# Patient Record
Sex: Female | Born: 1960 | Race: White | Hispanic: No | Marital: Married | Smoking: Never smoker
Health system: Southern US, Community
[De-identification: ages and names within clinical notes are randomized; demographics above are authoritative.]

## PROBLEM LIST (undated history)

## (undated) DIAGNOSIS — J45909 Unspecified asthma, uncomplicated: Secondary | ICD-10-CM

## (undated) DIAGNOSIS — G43909 Migraine, unspecified, not intractable, without status migrainosus: Secondary | ICD-10-CM

## (undated) DIAGNOSIS — I10 Essential (primary) hypertension: Secondary | ICD-10-CM

## (undated) HISTORY — PX: PARTIAL HYSTERECTOMY: SHX80

## (undated) HISTORY — PX: APPENDECTOMY: SHX54

---

## 2009-06-25 HISTORY — PX: BREAST BIOPSY: SHX20

## 2012-07-30 ENCOUNTER — Emergency Department: Payer: Self-pay

## 2012-07-30 LAB — CBC
HCT: 41.5 % (ref 35.0–47.0)
HGB: 14.2 g/dL (ref 12.0–16.0)
MCHC: 34.3 g/dL (ref 32.0–36.0)
Platelet: 265 10*3/uL (ref 150–440)
RBC: 4.59 10*6/uL (ref 3.80–5.20)
RDW: 13.5 % (ref 11.5–14.5)
WBC: 10.8 10*3/uL (ref 3.6–11.0)

## 2012-07-30 LAB — BASIC METABOLIC PANEL
Anion Gap: 7 (ref 7–16)
BUN: 15 mg/dL (ref 7–18)
Calcium, Total: 9.2 mg/dL (ref 8.5–10.1)
Chloride: 107 mmol/L (ref 98–107)
Creatinine: 0.88 mg/dL (ref 0.60–1.30)
EGFR (African American): >60
Glucose: 107 mg/dL — ABNORMAL HIGH (ref 65–99)
Osmolality: 279 (ref 275–301)
Potassium: 3.8 mmol/L (ref 3.5–5.1)
Sodium: 139 mmol/L (ref 136–145)

## 2012-09-17 ENCOUNTER — Ambulatory Visit: Payer: Self-pay | Admitting: Registered Nurse

## 2012-11-26 ENCOUNTER — Ambulatory Visit: Payer: Self-pay | Admitting: Internal Medicine

## 2012-12-22 ENCOUNTER — Ambulatory Visit: Payer: Self-pay | Admitting: Internal Medicine

## 2013-02-26 ENCOUNTER — Ambulatory Visit: Payer: Self-pay | Admitting: Specialist

## 2013-02-27 LAB — PATHOLOGY REPORT

## 2013-04-21 ENCOUNTER — Ambulatory Visit: Payer: Self-pay | Admitting: Internal Medicine

## 2013-04-28 ENCOUNTER — Ambulatory Visit: Payer: Self-pay | Admitting: Internal Medicine

## 2013-11-21 ENCOUNTER — Emergency Department: Payer: Self-pay | Admitting: Emergency Medicine

## 2013-11-21 LAB — CBC
HCT: 42 % (ref 35.0–47.0)
HGB: 13.9 g/dL (ref 12.0–16.0)
MCH: 29.3 pg (ref 26.0–34.0)
MCHC: 33.2 g/dL (ref 32.0–36.0)
MCV: 88 fL (ref 80–100)
Platelet: 227 10*3/uL (ref 150–440)
RBC: 4.75 10*6/uL (ref 3.80–5.20)
RDW: 14.1 % (ref 11.5–14.5)
WBC: 5.8 10*3/uL (ref 3.6–11.0)

## 2013-11-21 LAB — COMPREHENSIVE METABOLIC PANEL
ALBUMIN: 4.1 g/dL (ref 3.4–5.0)
AST: 11 U/L — AB (ref 15–37)
Alkaline Phosphatase: 83 U/L
Anion Gap: 7 (ref 7–16)
BUN: 14 mg/dL (ref 7–18)
Bilirubin,Total: 0.5 mg/dL (ref 0.2–1.0)
CO2: 26 mmol/L (ref 21–32)
CREATININE: 1.04 mg/dL (ref 0.60–1.30)
Calcium, Total: 9.6 mg/dL (ref 8.5–10.1)
Chloride: 104 mmol/L (ref 98–107)
EGFR (African American): >60
Glucose: 89 mg/dL (ref 65–99)
OSMOLALITY: 274 (ref 275–301)
POTASSIUM: 3.9 mmol/L (ref 3.5–5.1)
SGPT (ALT): 16 U/L (ref 12–78)
Sodium: 137 mmol/L (ref 136–145)
Total Protein: 7.4 g/dL (ref 6.4–8.2)

## 2013-12-09 ENCOUNTER — Ambulatory Visit: Payer: Self-pay | Admitting: Nurse Practitioner

## 2014-06-30 ENCOUNTER — Ambulatory Visit: Payer: Self-pay | Admitting: Otolaryngology

## 2014-10-15 NOTE — Op Note (Signed)
PATIENT NAME:  Kayla Christian, [place] MR#:  [id number] DATE OF BIRTH:  April 19, 1961  DATE OF PROCEDURE:  02/26/2013  PREOPERATIVE DIAGNOSIS:  Neuroma, left foot third web space.   POSTOPERATIVE DIAGNOSIS:  Neuroma, left foot third web space.   PROCEDURE: Excision of neuroma, left foot third web space.   SURGEON: Valinda HoarHoward E. Valencia Kassa, M.D.   ANESTHESIA: General LMA.   COMPLICATIONS: None.   DRAINS: None.   ESTIMATED BLOOD LOSS: None. Replaced: None.   DESCRIPTION OF PROCEDURE: The patient was brought to the operating room where she underwent satisfactory general LMA anesthesia in the supine position. The left leg was prepped and draped in a sterile fashion, and an Esmarch was applied to the tourniquet just above the ankle. The tourniquet was inflated to 250 mmHg. Tourniquet time was 17 minutes. The longitudinal incision was made in the dorsum of the left third web space extending proximally. Dissection was carried out bluntly under loupe magnification with electrocautery being used on small vessels. The laminar spreader was inserted, and the transverse metatarsal ligament was identified and divided. There was significant scarring in the distal pad of the web space. The medial and lateral branches of the nerve distally were identified and amputated distally. These were then followed proximally to the neuroma, which was freed up from scar tissue, and the nerve was followed proximally, proximal to the transverse metatarsal ligament and amputated. This was sent as a specimen. Additional dissection was carried out to remove scarring in web space. The wound was then irrigated. The sponge was inserted and the tourniquet deflated with good return of blood flow to all toes. There was minimal bleeding in the wound with electrocautery being used on small bleeders. The wound was then irrigated again and closed with 3-0 Vicryl in the subcutaneous tissue and 4-0 nylon on skin. Marcaine 0.5% was placed in the wound, and a dry  sterile dressing was applied. The patient was awakened and taken to recovery room in good condition.    ____________________________ Valinda HoarHoward E. Nehemie Casserly, MD hem:dmm D: 02/26/2013 13:41:40 ET T: 02/26/2013 14:48:26 ET JOB#: [id number]  cc: Valinda HoarHoward E. Alayia Meggison, MD, <Dictator> Valinda HoarHOWARD E Justinn Welter MD ELECTRONICALLY SIGNED 02/27/2013 13:16

## 2014-10-18 LAB — SURGICAL PATHOLOGY

## 2015-03-01 ENCOUNTER — Ambulatory Visit: Payer: Self-pay

## 2015-04-25 ENCOUNTER — Other Ambulatory Visit: Payer: Self-pay | Admitting: Nurse Practitioner

## 2015-04-25 DIAGNOSIS — Z1231 Encounter for screening mammogram for malignant neoplasm of breast: Secondary | ICD-10-CM

## 2015-05-05 ENCOUNTER — Ambulatory Visit
Admission: RE | Admit: 2015-05-05 | Discharge: 2015-05-05 | Disposition: A | Discharge status: Home / Self Care | Payer: 59 | Source: Ambulatory Visit | Attending: Nurse Practitioner | Admitting: Nurse Practitioner

## 2015-05-05 DIAGNOSIS — R921 Mammographic calcification found on diagnostic imaging of breast: Secondary | ICD-10-CM | POA: Diagnosis not present

## 2015-05-05 DIAGNOSIS — Z1231 Encounter for screening mammogram for malignant neoplasm of breast: Secondary | ICD-10-CM | POA: Insufficient documentation

## 2015-05-09 ENCOUNTER — Other Ambulatory Visit: Payer: Self-pay | Admitting: Nurse Practitioner

## 2015-05-09 DIAGNOSIS — R921 Mammographic calcification found on diagnostic imaging of breast: Secondary | ICD-10-CM

## 2015-05-11 ENCOUNTER — Ambulatory Visit
Admission: RE | Admit: 2015-05-11 | Discharge: 2015-05-11 | Disposition: A | Discharge status: Home / Self Care | Payer: 59 | Source: Ambulatory Visit | Attending: Nurse Practitioner | Admitting: Nurse Practitioner

## 2015-05-11 DIAGNOSIS — R921 Mammographic calcification found on diagnostic imaging of breast: Secondary | ICD-10-CM | POA: Diagnosis present

## 2015-05-24 ENCOUNTER — Ambulatory Visit: Payer: 59

## 2015-05-24 ENCOUNTER — Other Ambulatory Visit: Payer: 59

## 2015-06-19 IMAGING — CR DG FOOT COMPLETE 3+V*L*
1 series · 3 of 3 positions shown · non-contrast
Comparison: none

REASON FOR EXAM: lt foot and ankle pain/edema x 2 months no known injury
COMMENTS:

PROCEDURE:     DXR - DXR FOOT LT COMP W/OBLIQUES  - December 22, 2012  [DATE]
RESULT:     Comparison:  None

[Series 1: x foot ap left · 0.14mm/px · 3 of 3 slices shown]
[im 1/3]
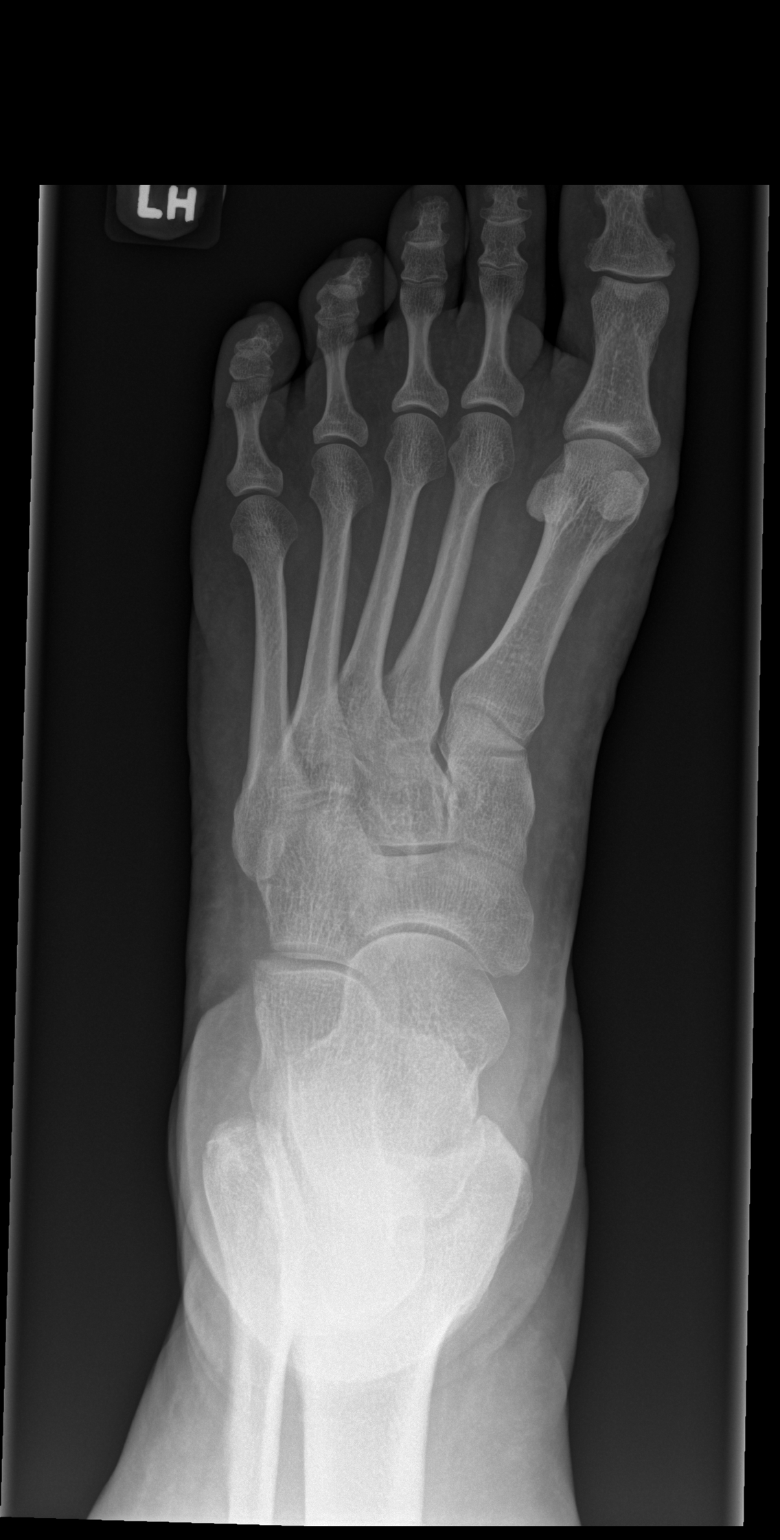
[im 2/3]
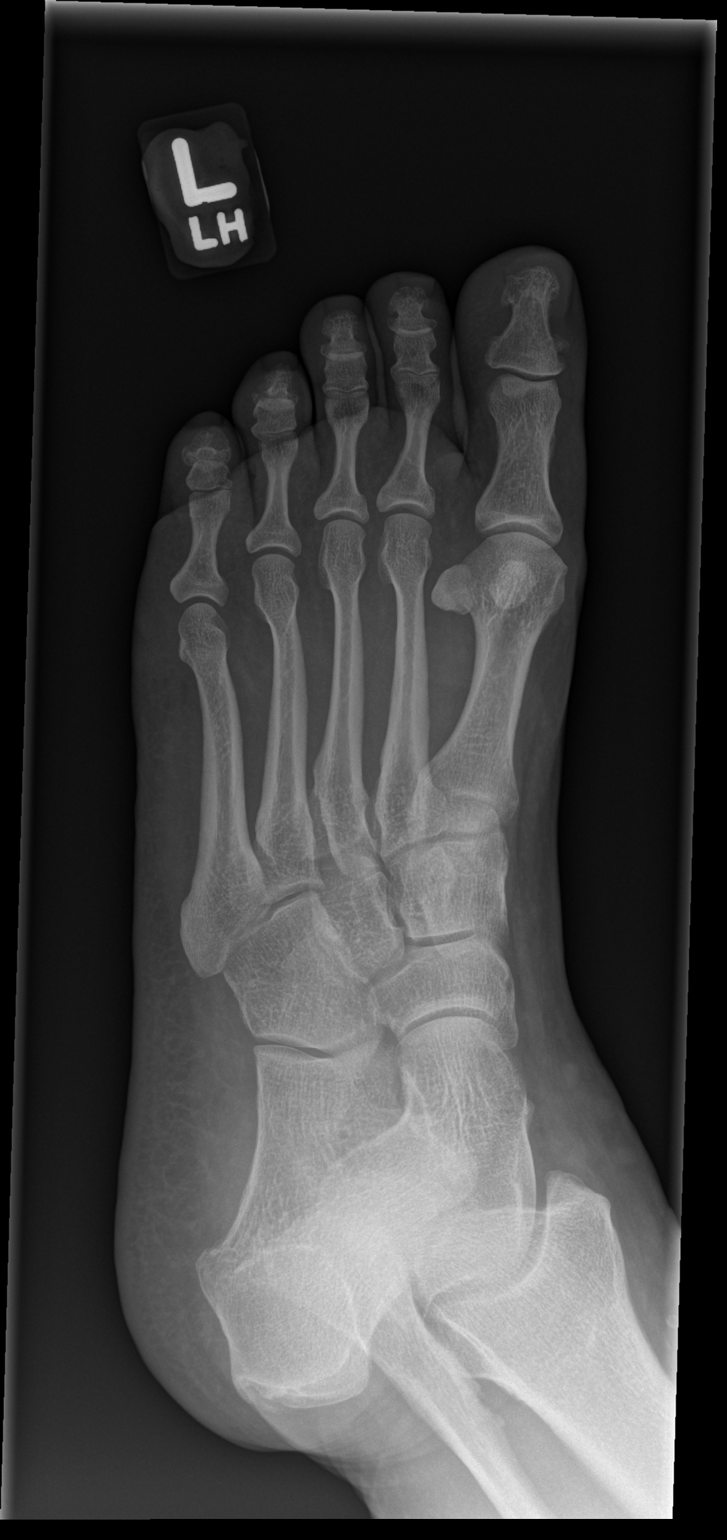
[im 3/3]
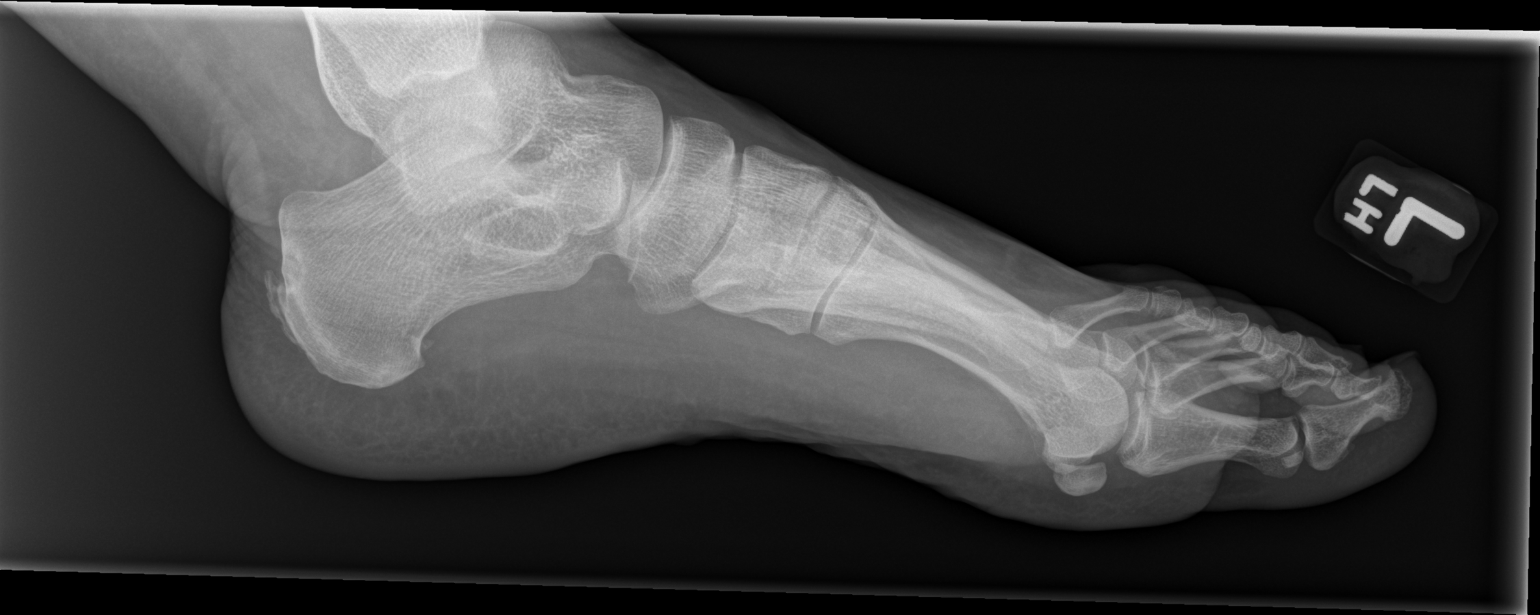

[3 of 3 positions shown; findings below may reference images not displayed]

FINDINGS: AP, oblique, and lateral views of the left foot demonstrates no fracture or
dislocation. There is no soft tissue abnormality. There is no subcutaneous
emphysema or radiopaque foreign bodies.
IMPRESSION: No acute osseous injury of the left foot.

[REDACTED]

## 2015-06-19 IMAGING — CR DG ANKLE COMPLETE 3+V*L*
1 series · 5 of 5 positions shown · non-contrast
Comparison: none

REASON FOR EXAM: lt foot and ankle pain/edema x 2 months no known injury
COMMENTS:

PROCEDURE:     DXR - DXR ANKLE LEFT COMPLETE  - December 22, 2012  [DATE]
RESULT:     Comparison: None

[Series 1: x ankle lat left · 0.14mm/px · 5 of 5 slices shown]
[im 1/5]
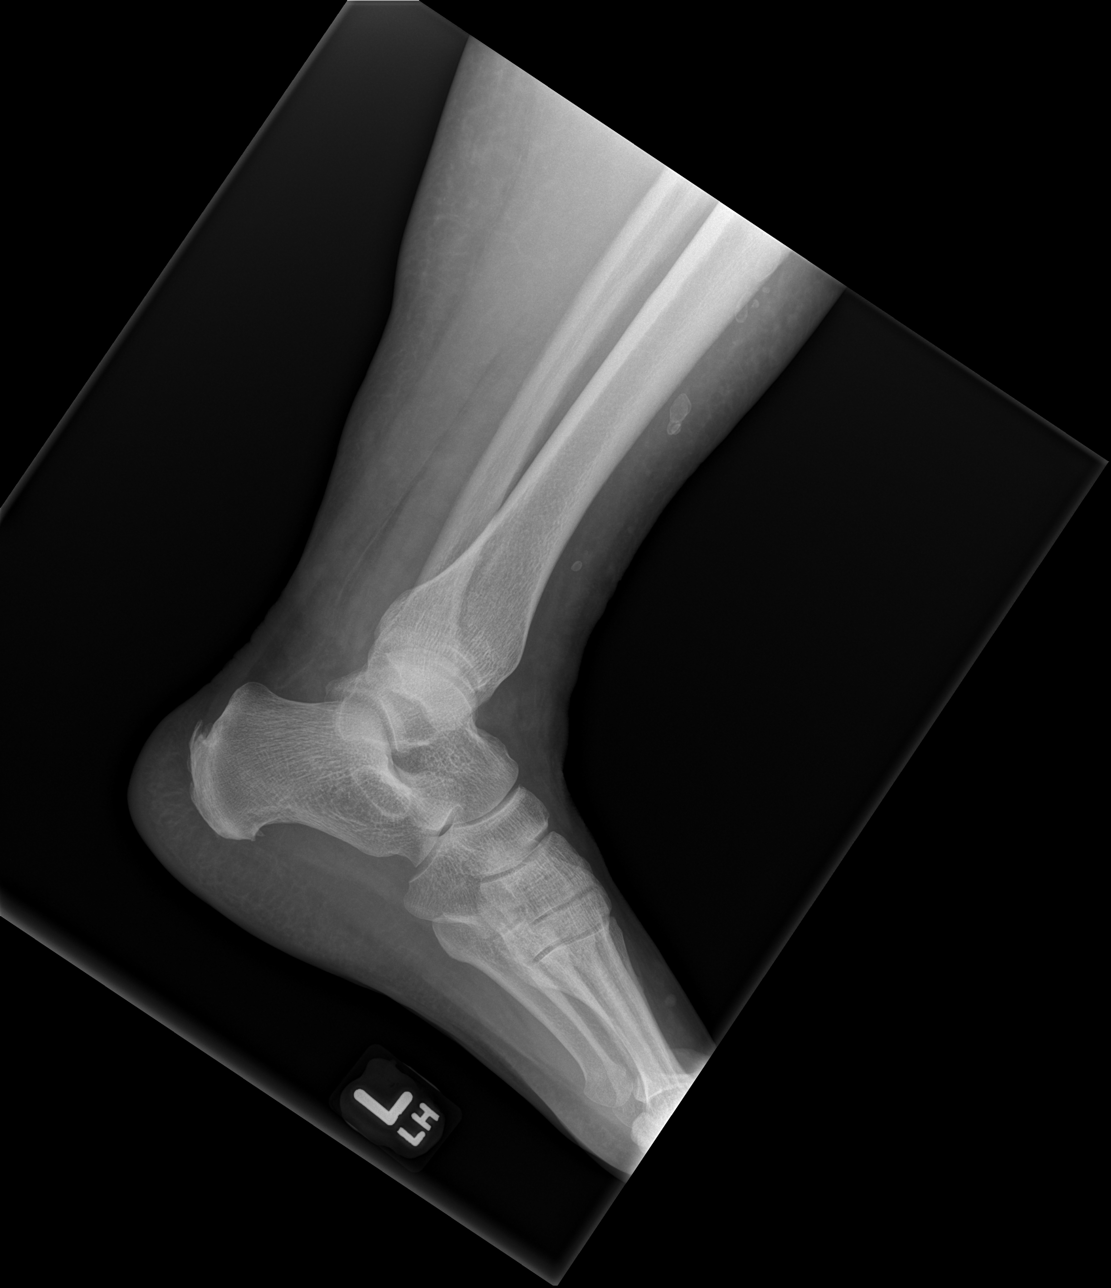
[im 2/5]
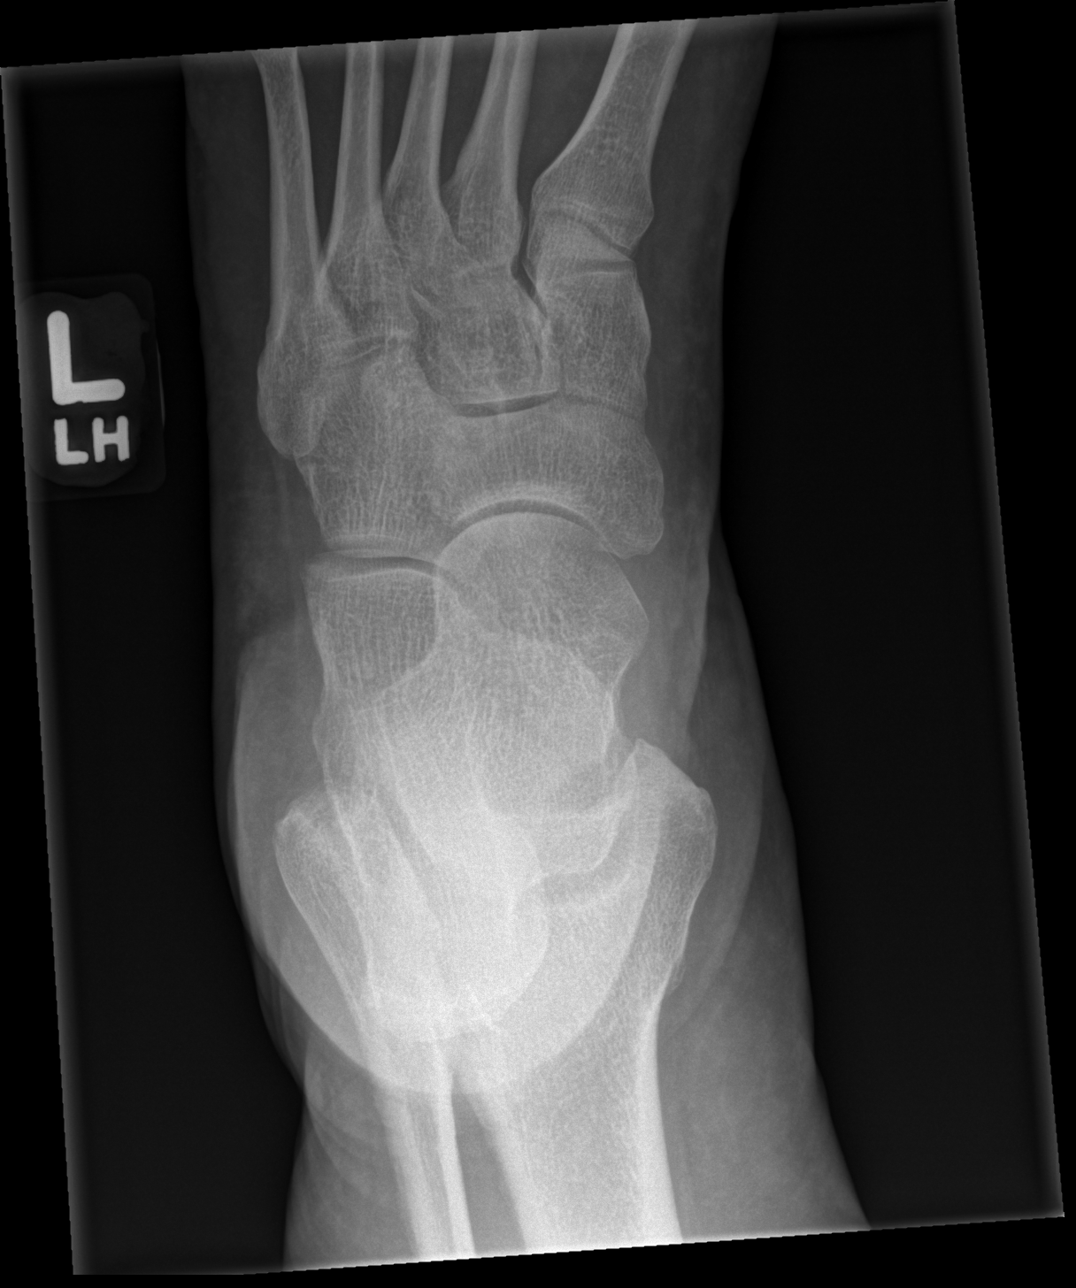
[im 3/5]
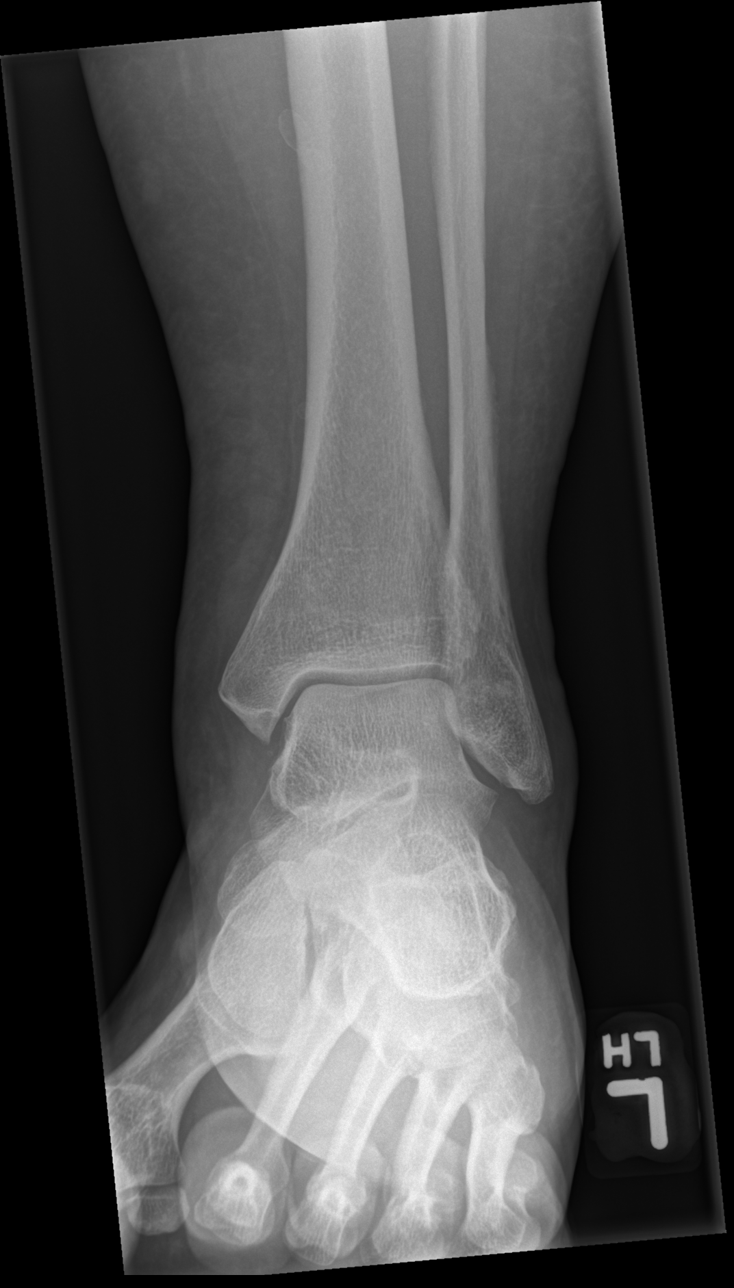
[im 4/5]
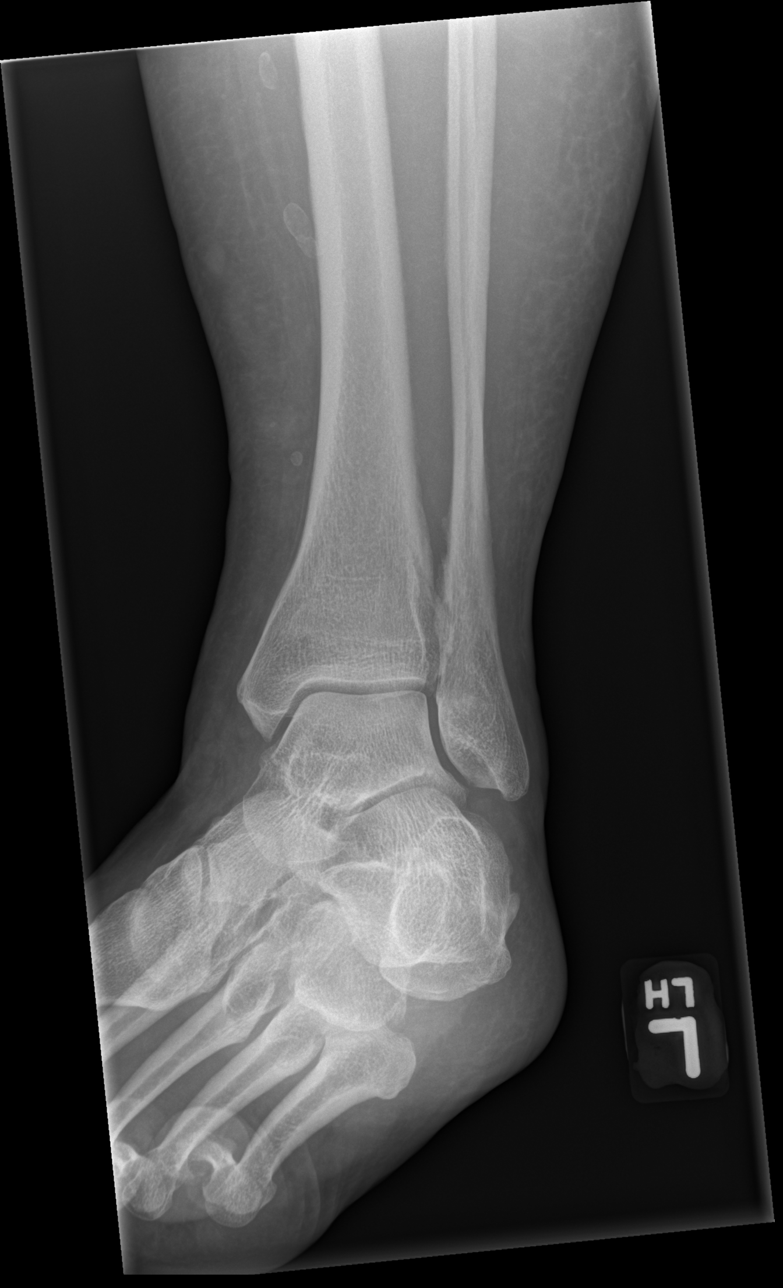
[im 5/5]
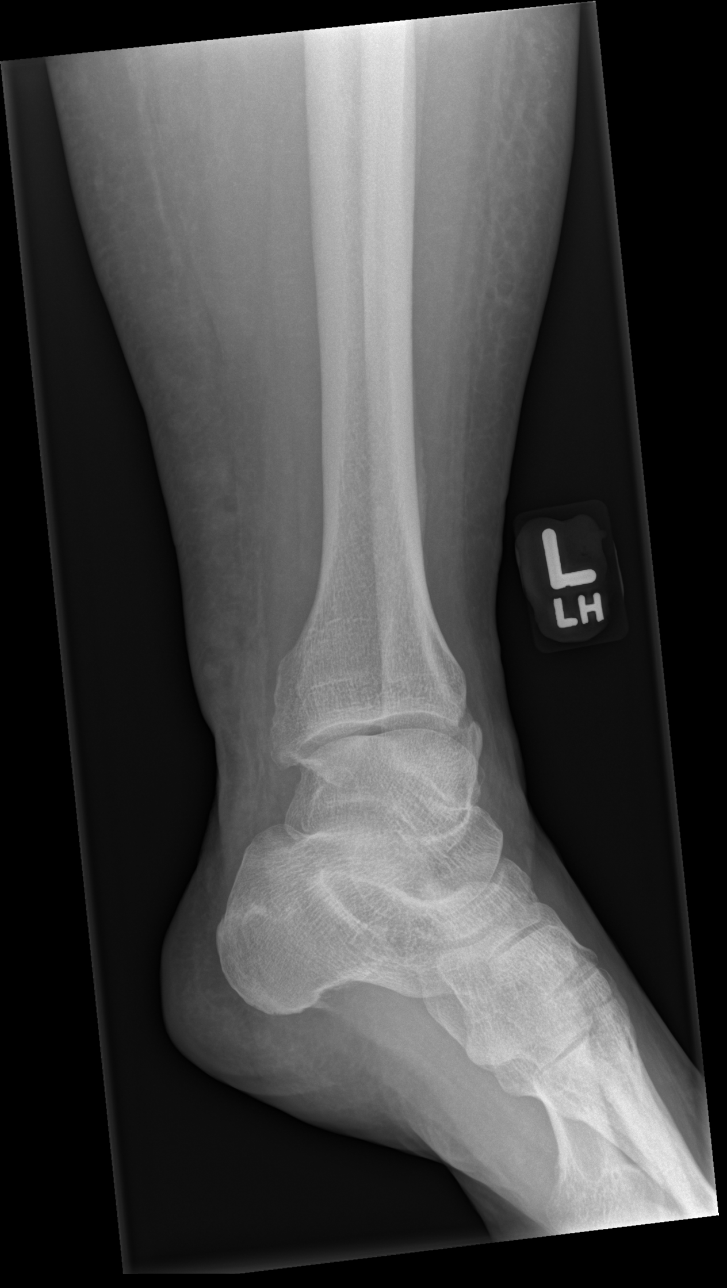

[5 of 5 positions shown; findings below may reference images not displayed]

FINDINGS: 5 views of the left ankle demonstrate no fracture or dislocation. There is a
tiny plantar calcaneal spur. There ankle mortise is intact. There is no
significant joint effusion. The soft tissues are normal.
IMPRESSION: No acute osseous injury of the left ankle.

[REDACTED]

## 2015-07-09 ENCOUNTER — Emergency Department
Admission: EM | Admit: 2015-07-09 | Discharge: 2015-07-09 | Disposition: A | Discharge status: Home / Self Care | Payer: 59 | Attending: Emergency Medicine | Admitting: Emergency Medicine

## 2015-07-09 ENCOUNTER — Encounter: Payer: Self-pay | Admitting: Medical Oncology

## 2015-07-09 DIAGNOSIS — Z792 Long term (current) use of antibiotics: Secondary | ICD-10-CM | POA: Diagnosis not present

## 2015-07-09 DIAGNOSIS — Z79899 Other long term (current) drug therapy: Secondary | ICD-10-CM | POA: Diagnosis not present

## 2015-07-09 DIAGNOSIS — R52 Pain, unspecified: Secondary | ICD-10-CM | POA: Diagnosis present

## 2015-07-09 DIAGNOSIS — J40 Bronchitis, not specified as acute or chronic: Secondary | ICD-10-CM

## 2015-07-09 DIAGNOSIS — J209 Acute bronchitis, unspecified: Secondary | ICD-10-CM | POA: Insufficient documentation

## 2015-07-09 DIAGNOSIS — J0100 Acute maxillary sinusitis, unspecified: Secondary | ICD-10-CM | POA: Diagnosis not present

## 2015-07-09 MED ORDER — FEXOFENADINE-PSEUDOEPHED ER 60-120 MG PO TB12: 1.0000 | ORAL_TABLET | Freq: Two times a day (BID) | ORAL | Status: DC

## 2015-07-09 MED ORDER — IBUPROFEN 800 MG PO TABS: 800.0000 mg | ORAL_TABLET | Freq: Three times a day (TID) | ORAL | Status: AC | PRN

## 2015-07-09 MED ORDER — ONDANSETRON 8 MG PO TBDP
8.0000 mg | ORAL_TABLET | Freq: Once | ORAL | Status: AC
  Administered 2015-07-09: 8 mg via ORAL
  Filled 2015-07-09: qty 1

## 2015-07-09 MED ORDER — TRAMADOL HCL 50 MG PO TABS: 50.0000 mg | ORAL_TABLET | Freq: Four times a day (QID) | ORAL | Status: AC | PRN

## 2015-07-09 MED ORDER — KETOROLAC TROMETHAMINE 60 MG/2ML IM SOLN
60.0000 mg | Freq: Once | INTRAMUSCULAR | Status: AC
  Administered 2015-07-09: 60 mg via INTRAMUSCULAR
  Filled 2015-07-09: qty 2

## 2015-07-09 MED ORDER — AMOXICILLIN 500 MG PO CAPS: 500.0000 mg | ORAL_CAPSULE | Freq: Three times a day (TID) | ORAL | Status: DC

## 2015-07-09 MED ORDER — HYDROMORPHONE HCL 1 MG/ML IJ SOLN
1.0000 mg | Freq: Once | INTRAMUSCULAR | Status: AC
  Administered 2015-07-09: 1 mg via INTRAMUSCULAR
  Filled 2015-07-09: qty 1

## 2015-07-09 MED ORDER — SULFAMETHOXAZOLE-TRIMETHOPRIM 800-160 MG PO TABS
1.0000 | ORAL_TABLET | Freq: Two times a day (BID) | ORAL | Status: DC
Start: 2015-07-09 — End: 2023-04-26

## 2015-07-09 NOTE — ED Provider Notes (Signed)
[facility] Emergency Department Provider Note  ____________________________________________  Time seen: Approximately 2:48 PM  I have reviewed the triage vital signs and the nursing notes.   HISTORY  Chief Complaint Generalized Body Aches and URI    HPI Kayla Christian is a 55 y.o. female patient complaining of 2-3 days of body aches and pains. She also complaining of sinus congestion, sore throat, nonproductive cough. Patient states she recently [place] from [place] from a business trip. Patient denies any vomiting or  diarrhea but states she's developing nausea.Patient states she's had intermittent fever and chills. No palliative measures taken for her complaint.   History reviewed. No pertinent past medical history.  There are no active problems to display for this patient.   Past Surgical History  Procedure Laterality Date  . Breast biopsy Bilateral 2011    neg    Current Outpatient Rx  Name  Route  Sig  Dispense  Refill  . amoxicillin (AMOXIL) 500 MG capsule   Oral   Take 1 capsule (500 mg total) by mouth 3 (three) times daily.   30 capsule   0   . fexofenadine-pseudoephedrine (ALLEGRA-D) 60-120 MG 12 hr tablet   Oral   Take 1 tablet by mouth 2 (two) times daily.   30 tablet   0   . ibuprofen (ADVIL,MOTRIN) 800 MG tablet   Oral   Take 1 tablet (800 mg total) by mouth every 8 (eight) hours as needed.   30 tablet   0   . traMADol (ULTRAM) 50 MG tablet   Oral   Take 1 tablet (50 mg total) by mouth every 6 (six) hours as needed for moderate pain.   12 tablet   0     Allergies Review of patient's allergies indicates not on file.  Family History  Problem Relation Age of Onset  . Breast cancer Mother 8330    Social History Social History  Substance Use Topics  . Smoking status: None  . Smokeless tobacco: None  . Alcohol Use: None    Review of Systems Constitutional: Fever and chills. Body ache Eyes: No visual  changes. ENT: No throat. Nasal congestion and facial pain. Cardiovascular: Denies chest pain. Respiratory: Denies shortness of breath. Gastrointestinal: No abdominal pain. Nausea, no vomiting.  No diarrhea.  No constipation. Genitourinary: Negative for dysuria. Musculoskeletal: Positive for back pain. Skin: Negative for rash. Neurological: Negative for headaches, focal weakness or numbness.    ____________________________________________   PHYSICAL EXAM:  VITAL SIGNS: ED Triage Vitals  Enc Vitals Group     BP 07/09/15 1406 150/80 mmHg     Pulse Rate 07/09/15 1406 107     Resp 07/09/15 1406 24     Temp 07/09/15 1406 99.9 F (37.7 C)     Temp Source 07/09/15 1406 Oral     SpO2 07/09/15 1406 99 %     Weight 07/09/15 1406 230 lb (104.327 kg)     Height 07/09/15 1406 5\' 7"  (1.702 m)     Head Cir --      Peak Flow --      Pain Score 07/09/15 1416 10     Pain Loc --      Pain Edu? --      Excl. in GC? --     Constitutional: Alert and oriented. Mild distress. Eyes: Conjunctivae are normal. PERRL. EOMI. Head: Atraumatic. Nose: Bilateral maxillary guarding. A edematous nasal turbinates. Mouth/Throat: Mucous membranes are moist.  Oropharynx erythematous. Erythematous with postnasal drainage.  Neck: No stridor.  No cervical spine tenderness to palpation. Hematological/Lymphatic/Immunilogical: No cervical lymphadenopathy. Cardiovascular: Normal rate, regular rhythm. Grossly normal heart sounds.  Good peripheral circulation. Respiratory: Normal respiratory effort.  No retractions. Lungs with mild Rales. Gastrointestinal: Soft and nontender. No distention. No abdominal bruits. No CVA tenderness. Musculoskeletal: No lower extremity tenderness nor edema.  No joint effusions. Neurologic:  Normal speech and language. No gross focal neurologic deficits are appreciated. No gait instability. Skin:  Skin is warm, dry and intact. No rash noted. Psychiatric: Mood and affect are normal. Speech  and behavior are normal.  ____________________________________________   LABS (all labs ordered are listed, but only abnormal results are displayed)  Labs Reviewed - No data to display ____________________________________________  EKG   ____________________________________________  RADIOLOGY   ____________________________________________   PROCEDURES  Procedure(s) performed: None  Critical Care performed: No  ____________________________________________   INITIAL IMPRESSION / ASSESSMENT AND PLAN / ED COURSE  Pertinent labs & imaging results that were available during my care of the patient were reviewed by me and considered in my medical decision making (see chart for details).  Maxillary sinusitis and bronchitis.. She given discharge care instructions. Patient given a prescription for amoxicillin. Allegra-D, Ibuprofen, and tramadol. Advised [facility] to [facility]. ____________________________________________   FINAL CLINICAL IMPRESSION(S) / ED DIAGNOSES  Final diagnoses:  Acute maxillary sinusitis, recurrence not specified  Bronchitis      Joni Reining, PA-C 07/09/15 1507  Governor Rooks, MD 07/10/15 [phone]

## 2015-07-09 NOTE — ED Notes (Signed)
Pt reports she has been having body aches and cold sx's for a couple of days.

## 2015-07-10 NOTE — Care Management Note (Signed)
Case Management Note  Patient Details  Name: Babs SciaraCynthia S Papandrea MRN: [id number] Date of Birth: 28-Jun-1960  Subjective/Objective:    Referral discussed with Shea Stakesona George at [facility]. Ms Earlene PlaterDavis chose [facility] because she has used them in the past. Vira BlancoDona has the discharge information and the home health orders for RN, Aid, PT, OT, SW. Updated Vira BlancoDona that Ms Earlene PlaterDavis goes to dialysis on Mon-Wed-Fri.                 Action/Plan:   Expected Discharge Date:                  Expected Discharge Plan:     In-House Referral:     Discharge planning Services     Post Acute Care Choice:    Choice offered to:     DME Arranged:    DME Agency:     HH Arranged:    HH Agency:     Status of Service:     Medicare Important Message Given:    Date Medicare IM Given:    Medicare IM give by:    Date Additional Medicare IM Given:    Additional Medicare Important Message give by:     If discussed at Long Length of Stay Meetings, dates discussed:    Additional Comments:  Rebekkah Powless A, RN 07/10/2015, 11:44 AM

## 2015-10-04 ENCOUNTER — Emergency Department
Admission: EM | Admit: 2015-10-04 | Discharge: 2015-10-04 | Disposition: A | Discharge status: Home / Self Care | Payer: Managed Care, Other (non HMO) | Attending: Emergency Medicine | Admitting: Emergency Medicine

## 2015-10-04 DIAGNOSIS — G43909 Migraine, unspecified, not intractable, without status migrainosus: Secondary | ICD-10-CM | POA: Diagnosis present

## 2015-10-04 DIAGNOSIS — G43809 Other migraine, not intractable, without status migrainosus: Secondary | ICD-10-CM

## 2015-10-04 MED ORDER — DIPHENHYDRAMINE HCL 50 MG/ML IJ SOLN
12.5000 mg | Freq: Once | INTRAMUSCULAR | Status: AC
  Administered 2015-10-04: 12.5 mg via INTRAVENOUS
  Filled 2015-10-04: qty 1

## 2015-10-04 MED ORDER — PROCHLORPERAZINE EDISYLATE 5 MG/ML IJ SOLN
10.0000 mg | Freq: Once | INTRAMUSCULAR | Status: AC
  Administered 2015-10-04: 10 mg via INTRAVENOUS
  Filled 2015-10-04: qty 2

## 2015-10-04 MED ORDER — SODIUM CHLORIDE 0.9 % IV SOLN
Freq: Once | INTRAVENOUS | Status: AC
  Administered 2015-10-04: 08:00:00 via INTRAVENOUS

## 2015-10-04 NOTE — Discharge Instructions (Signed)
Migraine Headache A migraine headache is an intense, throbbing pain on one or both sides of your head. A migraine can last for 30 minutes to several hours. CAUSES  The exact cause of a migraine headache is not always known. However, a migraine may be caused when nerves in the brain become irritated and release chemicals that cause inflammation. This causes pain. Certain things may also trigger migraines, such as:  Alcohol.  Smoking.  Stress.  Menstruation.  Aged cheeses.  Foods or drinks that contain nitrates, glutamate, aspartame, or tyramine.  Lack of sleep.  Chocolate.  Caffeine.  Hunger.  Physical exertion.  Fatigue.  Medicines used to treat chest pain (nitroglycerine), birth control pills, estrogen, and some blood pressure medicines. SIGNS AND SYMPTOMS  Pain on one or both sides of your head.  Pulsating or throbbing pain.  Severe pain that prevents daily activities.  Pain that is aggravated by any physical activity.  Nausea, vomiting, or both.  Dizziness.  Pain with exposure to bright lights, loud noises, or activity.  General sensitivity to bright lights, loud noises, or smells. Before you get a migraine, you may get warning signs that a migraine is coming (aura). An aura may include:  Seeing flashing lights.  Seeing bright spots, halos, or zigzag lines.  Having tunnel vision or blurred vision.  Having feelings of numbness or tingling.  Having trouble talking.  Having muscle weakness. DIAGNOSIS  A migraine headache is often diagnosed based on:  Symptoms.  Physical exam.  A CT scan or MRI of your head. These imaging tests cannot diagnose migraines, but they can help rule out other causes of headaches. TREATMENT Medicines may be given for pain and nausea. Medicines can also be given to help prevent recurrent migraines.  HOME CARE INSTRUCTIONS  Only take over-the-counter or prescription medicines for pain or discomfort as directed by your  health care provider. The use of long-term narcotics is not recommended.  Lie down in a dark, quiet room when you have a migraine.  Keep a journal to find out what may trigger your migraine headaches. For example, write down:  What you eat and drink.  How much sleep you get.  Any change to your diet or medicines.  Limit alcohol consumption.  Quit smoking if you smoke.  Get 7-9 hours of sleep, or as recommended by your health care provider.  Limit stress.  Keep lights dim if bright lights bother you and make your migraines worse. SEEK IMMEDIATE MEDICAL CARE IF:   Your migraine becomes severe.  You have a fever.  You have a stiff neck.  You have vision loss.  You have muscular weakness or loss of muscle control.  You start losing your balance or have trouble walking.  You feel faint or pass out.  You have severe symptoms that are different from your first symptoms. MAKE SURE YOU:   Understand these instructions.  Will watch your condition.  Will get help right away if you are not doing well or get worse.   This information is not intended to replace advice given to you by your health care provider. Make sure you discuss any questions you have with your health care provider.   Document Released: 06/11/2005 Document Revised: 07/02/2014 Document Reviewed: 02/16/2013 Elsevier Interactive Patient Education [employer]2016 [employer].  Please return for any further problems follow-up with your doctor or see the trauma clinic or Kayla Christian or one of the other local healthcare providers

## 2015-10-04 NOTE — ED Notes (Signed)
Pt in with co migraine that started tonight, hx of the same.

## 2015-10-04 NOTE — ED Provider Notes (Signed)
[facility] Emergency Department Provider Note  ____________________________________________  Time seen: Approximately 8:20 AM  I have reviewed the triage vital signs and the nursing notes.   HISTORY  Chief Complaint Migraine    HPI Kayla Christian is a 55 y.o. female who complains of onset of her usual bad migraine although this was aware she's had in some time. She reports her usual blurry vision nausea vomiting headache made worse by bright lights or loud noises. Headache is diffuse especially frontal and again basically the same she's had before 8-9 out of 10. Headache started last night   No past medical history on file.  There are no active problems to display for this patient.   Past Surgical History  Procedure Laterality Date  . Breast biopsy Bilateral 2011    neg    Current Outpatient Rx  Name  Route  Sig  Dispense  Refill  . fexofenadine-pseudoephedrine (ALLEGRA-D) 60-120 MG 12 hr tablet   Oral   Take 1 tablet by mouth 2 (two) times daily.   30 tablet   0   . ibuprofen (ADVIL,MOTRIN) 800 MG tablet   Oral   Take 1 tablet (800 mg total) by mouth every 8 (eight) hours as needed.   30 tablet   0   . sulfamethoxazole-trimethoprim (BACTRIM DS,SEPTRA DS) 800-160 MG tablet   Oral   Take 1 tablet by mouth 2 (two) times daily.   20 tablet   0   . traMADol (ULTRAM) 50 MG tablet   Oral   Take 1 tablet (50 mg total) by mouth every 6 (six) hours as needed for moderate pain.   12 tablet   0     Allergies Penicillins and Prednisone  Family History  Problem Relation Age of Onset  . Breast cancer Mother 3530    Social History Social History  Substance Use Topics  . Smoking status: Not on file  . Smokeless tobacco: Not on file  . Alcohol Use: Not on file    Review of Systems Constitutional: No fever/chills Eyes: See history of present illness ENT: No sore throat. Cardiovascular: Denies chest pain. Respiratory: Denies shortness  of breath. Gastrointestinal: No abdominal pain.  No nausea, no vomiting.  No diarrhea.  No constipation. Genitourinary: Negative for dysuria. Musculoskeletal: Negative for back pain. Skin: Negative for rash. Neurological: Negative for headaches, focal weakness or numbness.  10-point ROS otherwise negative.  ____________________________________________   PHYSICAL EXAM:  VITAL SIGNS: ED Triage Vitals  Enc Vitals Group     BP 10/04/15 0629 157/88 mmHg     Pulse Rate 10/04/15 0627 78     Resp 10/04/15 0627 18     Temp 10/04/15 0627 97.6 F (36.4 C)     Temp Source 10/04/15 0627 Oral     SpO2 10/04/15 0627 97 %     Weight 10/04/15 0627 220 lb (99.791 kg)     Height 10/04/15 0627 5\' 6"  (1.676 m)     Head Cir --      Peak Flow --      Pain Score 10/04/15 0628 10     Pain Loc --      Pain Edu? --      Excl. in GC? --     Constitutional: Alert and oriented. Leaning over the vomiting bag and crying.. Eyes: Conjunctivae are normal. PERRL. EOMI. Head: Atraumatic. Nose: No congestion/rhinnorhea. Mouth/Throat: Mucous membranes are moist.  Oropharynx non-erythematous. Neck: No stridor. Cardiovascular: Normal rate, regular rhythm. Grossly normal  heart sounds.  Good peripheral circulation. Respiratory: Normal respiratory effort.  No retractions. Lungs CTAB. Gastrointestinal: Soft and nontender. No distention. No abdominal bruits. No CVA tenderness.    Musculoskeletal: No lower extremity tenderness nor edema.  No joint effusions. Neurologic:  Normal speech and language. No gross focal neurologic deficits are appreciated. No gait instability. Skin:  Skin is warm, dry and intact. No rash noted. Psychiatric: Mood and affect are normal. Speech and behavior are normal.  ____________________________________________   LABS (all labs ordered are listed, but only abnormal results are displayed)  Labs Reviewed - No data to  display ____________________________________________  EKG   ____________________________________________  RADIOLOGY   ____________________________________________   PROCEDURES Patient given fluids and Compazine and Benadryl and seems to be improving.   ____________________________________________   INITIAL IMPRESSION / ASSESSMENT AND PLAN / ED COURSE  Pertinent labs & imaging results that were available during my care of the patient were reviewed by me and considered in my medical decision making (see chart for details).  ____________________________________________   FINAL CLINICAL IMPRESSION(S) / ED DIAGNOSES  Final diagnoses:  Other type of migraine      Arnaldo Natal, MD 10/04/15 0930

## 2015-10-04 NOTE — ED Notes (Signed)
Pt has a migraine - c/o nausea and vomiting - c/o blurred vision - no other symptoms

## 2016-05-28 ENCOUNTER — Other Ambulatory Visit: Payer: Self-pay | Admitting: Nurse Practitioner

## 2016-05-28 DIAGNOSIS — R1030 Lower abdominal pain, unspecified: Secondary | ICD-10-CM

## 2016-05-29 ENCOUNTER — Ambulatory Visit
Admission: RE | Admit: 2016-05-29 | Discharge: 2016-05-29 | Disposition: A | Discharge status: Home / Self Care | Payer: Managed Care, Other (non HMO) | Source: Ambulatory Visit | Attending: Nurse Practitioner | Admitting: Nurse Practitioner

## 2016-05-29 DIAGNOSIS — R1030 Lower abdominal pain, unspecified: Secondary | ICD-10-CM | POA: Diagnosis present

## 2016-05-29 DIAGNOSIS — Z90710 Acquired absence of both cervix and uterus: Secondary | ICD-10-CM | POA: Insufficient documentation

## 2016-06-04 ENCOUNTER — Other Ambulatory Visit: Payer: Self-pay | Admitting: Nurse Practitioner

## 2016-06-04 DIAGNOSIS — R921 Mammographic calcification found on diagnostic imaging of breast: Secondary | ICD-10-CM

## 2016-06-04 DIAGNOSIS — Z1231 Encounter for screening mammogram for malignant neoplasm of breast: Secondary | ICD-10-CM

## 2016-06-06 ENCOUNTER — Emergency Department
Admission: EM | Admit: 2016-06-06 | Discharge: 2016-06-06 | Disposition: A | Discharge status: Home / Self Care | Payer: Managed Care, Other (non HMO) | Attending: Emergency Medicine | Admitting: Emergency Medicine

## 2016-06-06 ENCOUNTER — Encounter: Payer: Self-pay | Admitting: Emergency Medicine

## 2016-06-06 DIAGNOSIS — J40 Bronchitis, not specified as acute or chronic: Secondary | ICD-10-CM | POA: Diagnosis not present

## 2016-06-06 DIAGNOSIS — H6692 Otitis media, unspecified, left ear: Secondary | ICD-10-CM | POA: Diagnosis not present

## 2016-06-06 DIAGNOSIS — Z791 Long term (current) use of non-steroidal anti-inflammatories (NSAID): Secondary | ICD-10-CM | POA: Diagnosis not present

## 2016-06-06 DIAGNOSIS — R05 Cough: Secondary | ICD-10-CM | POA: Diagnosis present

## 2016-06-06 DIAGNOSIS — H6690 Otitis media, unspecified, unspecified ear: Secondary | ICD-10-CM

## 2016-06-06 HISTORY — DX: Migraine, unspecified, not intractable, without status migrainosus: G43.909

## 2016-06-06 HISTORY — DX: Unspecified asthma, uncomplicated: J45.909

## 2016-06-06 MED ORDER — AZITHROMYCIN 500 MG PO TABS: 500.0000 mg | ORAL_TABLET | Freq: Every day | ORAL | 0 refills | Status: AC

## 2016-06-06 MED ORDER — AZITHROMYCIN 500 MG PO TABS
500.0000 mg | ORAL_TABLET | Freq: Once | ORAL | Status: AC
  Administered 2016-06-06: 500 mg via ORAL
  Filled 2016-06-06: qty 1

## 2016-06-06 MED ORDER — IBUPROFEN 800 MG PO TABS
ORAL_TABLET | ORAL | Status: AC
  Administered 2016-06-06: 800 mg via ORAL
  Filled 2016-06-06: qty 1

## 2016-06-06 MED ORDER — LIDOCAINE VISCOUS 2 % MT SOLN
15.0000 mL | Freq: Once | OROMUCOSAL | Status: AC
  Administered 2016-06-06: 15 mL via OROMUCOSAL

## 2016-06-06 MED ORDER — LIDOCAINE VISCOUS 2 % MT SOLN
OROMUCOSAL | Status: AC
  Administered 2016-06-06: 15 mL via OROMUCOSAL
  Filled 2016-06-06: qty 15

## 2016-06-06 MED ORDER — IBUPROFEN 800 MG PO TABS
800.0000 mg | ORAL_TABLET | Freq: Once | ORAL | Status: AC
  Administered 2016-06-06: 800 mg via ORAL

## 2016-06-06 NOTE — ED Triage Notes (Signed)
Pt ambulatory to triage with steady gait with c/o left earache since 2 am this morning, pt states has been coughing for the past couple of days. Pt tearful in triage, holding pressure to left ear. .Marland Kitchen

## 2016-06-06 NOTE — ED Provider Notes (Signed)
[facility] Emergency Department Provider Note   First MD Initiated Contact with Patient 06/06/16 [phone]     (approximate)  I have reviewed the triage vital signs and the nursing notes.   HISTORY  Chief Complaint Otalgia    HPI Babs SciaraCynthia S Peabody is a 55 y.o. female presents with left earache 2 days accompanied by productive cough with greenish sputum. Patient states that her left ear ache acutely worsened tonight at 2 AM. Patient states current pain score is 9 out of 10. Patient denies any fever or febrile on presentation with temperature 98.2.   Past Medical History:  Diagnosis Date  . Asthma   . Migraines     There are no active problems to display for this patient.   Past Surgical History:  Procedure Laterality Date  . APPENDECTOMY    . BREAST BIOPSY Bilateral 2011   neg  . PARTIAL HYSTERECTOMY      Prior to Admission medications   Medication Sig Start Date End Date Taking? Authorizing Provider  fexofenadine-pseudoephedrine (ALLEGRA-D) 60-120 MG 12 hr tablet Take 1 tablet by mouth 2 (two) times daily. 07/09/15   Joni Reiningonald K Smith, PA-C  ibuprofen (ADVIL,MOTRIN) 800 MG tablet Take 1 tablet (800 mg total) by mouth every 8 (eight) hours as needed. 07/09/15   Joni Reiningonald K Smith, PA-C  sulfamethoxazole-trimethoprim (BACTRIM DS,SEPTRA DS) 800-160 MG tablet Take 1 tablet by mouth 2 (two) times daily. 07/09/15   Joni Reiningonald K Smith, PA-C  traMADol (ULTRAM) 50 MG tablet Take 1 tablet (50 mg total) by mouth every 6 (six) hours as needed for moderate pain. 07/09/15   Joni Reiningonald K Smith, PA-C    Allergies Penicillins and Prednisone  Family History  Problem Relation Age of Onset  . Breast cancer Mother 8930    Social History Social History  Substance Use Topics  . Smoking status: Never Smoker  . Smokeless tobacco: Never Used  . Alcohol use No    Review of Systems Constitutional: No fever/chills Eyes: No visual changes. ENT: No sore throat.Positive for left ear  pain Cardiovascular: Denies chest pain. Respiratory: Denies shortness of breath. Positive for productive cough Gastrointestinal: No abdominal pain.  No nausea, no vomiting.  No diarrhea.  No constipation. Genitourinary: Negative for dysuria. Musculoskeletal: Negative for back pain. Skin: Negative for rash. Neurological: Negative for headaches, focal weakness or numbness.   10-point ROS otherwise negative.  ____________________________________________   PHYSICAL EXAM:  VITAL SIGNS: ED Triage Vitals  Enc Vitals Group     BP 06/06/16 0606 (!) 162/104     Pulse Rate 06/06/16 0606 (!) 108     Resp 06/06/16 0606 20     Temp 06/06/16 0606 98.2 F (36.8 C)     Temp Source 06/06/16 0606 Oral     SpO2 06/06/16 0606 98 %     Weight 06/06/16 0607 238 lb (108 kg)     Height 06/06/16 0607 5\' 7"  (1.702 m)     Head Circumference --      Peak Flow --      Pain Score 06/06/16 0608 10     Pain Loc --      Pain Edu? --      Excl. in GC? --     Constitutional: Alert and oriented. Well appearing and in no acute distress. Eyes: Conjunctivae are normal. PERRL. EOMI. Head: Atraumatic. Ears:  Healthy appearing ear canals and Left TM dullness with posterior exudate noted no evidence of perforation Nose: No congestion/rhinnorhea. Mouth/Throat: Mucous membranes  are moist.  Oropharynx non-erythematous. Neck: No stridor.  No meningeal signs. Cardiovascular: Normal rate, regular rhythm. Good peripheral circulation. Grossly normal heart sounds. Respiratory: Normal respiratory effort.  No retractions. Lungs CTAB. Gastrointestinal: Soft and nontender. No distention.  Musculoskeletal: No lower extremity tenderness nor edema. No gross deformities of extremities. Neurologic:  Normal speech and language. No gross focal neurologic deficits are appreciated.  Skin:  Skin is warm, dry and intact. No rash noted. Psychiatric: Mood and affect are normal. Speech and behavior are  normal.   Procedures     INITIAL IMPRESSION / ASSESSMENT AND PLAN / ED COURSE  Pertinent labs & imaging results that were available during my care of the patient were reviewed by me and considered in my medical decision making (see chart for details).  History physical exam consistent with left otitis media and bronchitis. Patient will be prescribed azithromycin secondary to penicillin allergy. Spoke with patient regarding the risk of possible perforation and symptoms that would be consistent with such   Clinical Course     ____________________________________________  FINAL CLINICAL IMPRESSION(S) / ED DIAGNOSES  Final diagnoses:  Acute otitis media, unspecified otitis media type  Bronchitis     MEDICATIONS GIVEN DURING THIS VISIT:  Medications  lidocaine (XYLOCAINE) 2 % viscous mouth solution 15 mL (15 mLs Mouth/Throat Given 06/06/16 0625)  ibuprofen (ADVIL,MOTRIN) tablet 800 mg (800 mg Oral Given 06/06/16 0625)  azithromycin (ZITHROMAX) tablet 500 mg (500 mg Oral Given 06/06/16 0631)     NEW OUTPATIENT MEDICATIONS STARTED DURING THIS VISIT:  New Prescriptions   No medications on file    Modified Medications   No medications on file    Discontinued Medications   No medications on file     Note:  This document was prepared using Dragon voice recognition software and may include unintentional dictation errors.    Darci Currentandolph N Brown, MD 06/06/16 [phone]

## 2016-06-22 ENCOUNTER — Other Ambulatory Visit: Payer: Managed Care, Other (non HMO)

## 2016-06-22 ENCOUNTER — Ambulatory Visit: Payer: Managed Care, Other (non HMO)

## 2016-07-12 ENCOUNTER — Ambulatory Visit: Payer: Managed Care, Other (non HMO)

## 2016-07-12 ENCOUNTER — Other Ambulatory Visit: Payer: Managed Care, Other (non HMO)

## 2017-04-26 ENCOUNTER — Other Ambulatory Visit: Payer: Self-pay | Admitting: Nurse Practitioner

## 2017-04-26 DIAGNOSIS — Z1231 Encounter for screening mammogram for malignant neoplasm of breast: Secondary | ICD-10-CM

## 2017-05-24 ENCOUNTER — Ambulatory Visit
Admission: RE | Admit: 2017-05-24 | Discharge: 2017-05-24 | Disposition: A | Discharge status: Home / Self Care | Payer: BLUE CROSS/BLUE SHIELD | Source: Ambulatory Visit | Attending: Nurse Practitioner | Admitting: Nurse Practitioner

## 2017-05-24 DIAGNOSIS — R921 Mammographic calcification found on diagnostic imaging of breast: Secondary | ICD-10-CM | POA: Diagnosis not present

## 2017-05-24 DIAGNOSIS — Z1231 Encounter for screening mammogram for malignant neoplasm of breast: Secondary | ICD-10-CM

## 2018-01-07 ENCOUNTER — Emergency Department: Payer: BLUE CROSS/BLUE SHIELD

## 2018-01-07 ENCOUNTER — Other Ambulatory Visit: Payer: Self-pay

## 2018-01-07 DIAGNOSIS — Z79899 Other long term (current) drug therapy: Secondary | ICD-10-CM | POA: Diagnosis not present

## 2018-01-07 DIAGNOSIS — K859 Acute pancreatitis without necrosis or infection, unspecified: Secondary | ICD-10-CM | POA: Insufficient documentation

## 2018-01-07 DIAGNOSIS — I10 Essential (primary) hypertension: Secondary | ICD-10-CM | POA: Insufficient documentation

## 2018-01-07 DIAGNOSIS — R079 Chest pain, unspecified: Secondary | ICD-10-CM | POA: Diagnosis present

## 2018-01-07 DIAGNOSIS — J45909 Unspecified asthma, uncomplicated: Secondary | ICD-10-CM | POA: Insufficient documentation

## 2018-01-07 DIAGNOSIS — R1084 Generalized abdominal pain: Secondary | ICD-10-CM | POA: Diagnosis not present

## 2018-01-07 LAB — COMPREHENSIVE METABOLIC PANEL
ALT: 13 U/L (ref 0–44)
ANION GAP: 8 (ref 5–15)
AST: 15 U/L (ref 15–41)
Albumin: 4.3 g/dL (ref 3.5–5.0)
Alkaline Phosphatase: 98 U/L (ref 38–126)
BUN: 14 mg/dL (ref 6–20)
CALCIUM: 9.8 mg/dL (ref 8.9–10.3)
CO2: 28 mmol/L (ref 22–32)
Chloride: 103 mmol/L (ref 98–111)
Creatinine, Ser: 1.06 mg/dL — ABNORMAL HIGH (ref 0.44–1.00)
GFR calc Af Amer: >60 mL/min (ref >60)
GFR, EST NON AFRICAN AMERICAN: 58 mL/min — AB (ref >60)
Glucose, Bld: 115 mg/dL — ABNORMAL HIGH (ref 70–99)
POTASSIUM: 4.4 mmol/L (ref 3.5–5.1)
SODIUM: 139 mmol/L (ref 135–145)
Total Bilirubin: 0.8 mg/dL (ref 0.3–1.2)
Total Protein: 7.6 g/dL (ref 6.5–8.1)

## 2018-01-07 LAB — CBC
HCT: 39.8 % (ref 35.0–47.0)
Hemoglobin: 14.1 g/dL (ref 12.0–16.0)
MCH: 30.8 pg (ref 26.0–34.0)
MCHC: 35.5 g/dL (ref 32.0–36.0)
MCV: 86.6 fL (ref 80.0–100.0)
Platelets: 261 10*3/uL (ref 150–440)
RBC: 4.59 MIL/uL (ref 3.80–5.20)
RDW: 14.1 % (ref 11.5–14.5)
WBC: 8.8 10*3/uL (ref 3.6–11.0)

## 2018-01-07 LAB — TROPONIN I: Troponin I: <0.03 ng/mL (ref <0.03)

## 2018-01-07 LAB — LIPASE, BLOOD: LIPASE: 42 U/L (ref 11–51)

## 2018-01-07 NOTE — ED Triage Notes (Signed)
Pt in with co lower abd pain, mid back pain and chest pain since 1200 today. States became severe around 1600, hx of diverticulitis but states does not feel the same. Has had nausea no vomiting, has had diarrhea for months.

## 2018-01-08 ENCOUNTER — Emergency Department: Payer: BLUE CROSS/BLUE SHIELD

## 2018-01-08 ENCOUNTER — Emergency Department
Admission: EM | Admit: 2018-01-08 | Discharge: 2018-01-08 | Disposition: A | Discharge status: Home / Self Care | Payer: BLUE CROSS/BLUE SHIELD | Attending: Emergency Medicine | Admitting: Emergency Medicine

## 2018-01-08 DIAGNOSIS — K859 Acute pancreatitis without necrosis or infection, unspecified: Secondary | ICD-10-CM

## 2018-01-08 DIAGNOSIS — R1084 Generalized abdominal pain: Secondary | ICD-10-CM

## 2018-01-08 HISTORY — DX: Essential (primary) hypertension: I10

## 2018-01-08 LAB — URINALYSIS, COMPLETE (UACMP) WITH MICROSCOPIC
Bilirubin Urine: NEGATIVE
GLUCOSE, UA: NEGATIVE mg/dL
KETONES UR: NEGATIVE mg/dL
Leukocytes, UA: NEGATIVE
Nitrite: NEGATIVE
PROTEIN: NEGATIVE mg/dL
Specific Gravity, Urine: 1.012 (ref 1.005–1.030)
pH: 6 (ref 5.0–8.0)

## 2018-01-08 MED ORDER — MORPHINE SULFATE (PF) 4 MG/ML IV SOLN
INTRAVENOUS | Status: AC
  Filled 2018-01-08: qty 1

## 2018-01-08 MED ORDER — IOHEXOL 300 MG/ML  SOLN
100.0000 mL | Freq: Once | INTRAMUSCULAR | Status: AC | PRN
  Administered 2018-01-08: 100 mL via INTRAVENOUS

## 2018-01-08 MED ORDER — ONDANSETRON HCL 4 MG/2ML IJ SOLN
INTRAMUSCULAR | Status: AC
  Filled 2018-01-08: qty 2

## 2018-01-08 MED ORDER — OXYCODONE-ACETAMINOPHEN 5-325 MG PO TABS: 1.0000 | ORAL_TABLET | ORAL | 0 refills | Status: DC | PRN

## 2018-01-08 MED ORDER — OXYCODONE-ACETAMINOPHEN 5-325 MG PO TABS
1.0000 | ORAL_TABLET | Freq: Once | ORAL | Status: AC
  Administered 2018-01-08: 1 via ORAL
  Filled 2018-01-08: qty 1

## 2018-01-08 MED ORDER — HYDROMORPHONE HCL 1 MG/ML IJ SOLN
0.5000 mg | Freq: Once | INTRAMUSCULAR | Status: AC
  Administered 2018-01-08: 0.5 mg via INTRAVENOUS
  Filled 2018-01-08: qty 1

## 2018-01-08 MED ORDER — IOPAMIDOL (ISOVUE-300) INJECTION 61%
30.0000 mL | Freq: Once | INTRAVENOUS | Status: AC | PRN
  Administered 2018-01-08: 30 mL via ORAL

## 2018-01-08 MED ORDER — SODIUM CHLORIDE 0.9 % IV BOLUS
1000.0000 mL | Freq: Once | INTRAVENOUS | Status: AC
  Administered 2018-01-08: 1000 mL via INTRAVENOUS

## 2018-01-08 MED ORDER — ONDANSETRON 4 MG PO TBDP: 4.0000 mg | ORAL_TABLET | Freq: Three times a day (TID) | ORAL | 0 refills | Status: DC | PRN

## 2018-01-08 MED ORDER — ONDANSETRON HCL 4 MG/2ML IJ SOLN
4.0000 mg | Freq: Once | INTRAMUSCULAR | Status: AC
  Administered 2018-01-08: 4 mg via INTRAVENOUS

## 2018-01-08 MED ORDER — MORPHINE SULFATE (PF) 4 MG/ML IV SOLN
4.0000 mg | Freq: Once | INTRAVENOUS | Status: AC
  Administered 2018-01-08: 4 mg via INTRAVENOUS

## 2018-01-08 MED ORDER — ONDANSETRON HCL 4 MG/2ML IJ SOLN
4.0000 mg | Freq: Once | INTRAMUSCULAR | Status: AC
  Administered 2018-01-08: 4 mg via INTRAVENOUS
  Filled 2018-01-08: qty 2

## 2018-01-08 NOTE — ED Notes (Signed)
Patient transported to CT 

## 2018-01-08 NOTE — Discharge Instructions (Addendum)
1.  You may take medicines as needed for pain and nausea (Percocet/Zofran #30). 2.  Clear liquids x12 hours, then bland diet x5 days, then slowly advance diet as tolerated.  Avoid heavy, greasy, spicy foods and alcohol. 3.  Return to the ER for worsening symptoms, persistent vomiting, difficulty breathing or other concerns.

## 2018-01-08 NOTE — ED Notes (Signed)
Patient transported to CT at this time. 

## 2018-01-08 NOTE — ED Provider Notes (Signed)
[facility] Emergency Department Provider Note   ____________________________________________   First MD Initiated Contact with Patient 01/08/18 [phone]     (approximate)  I have reviewed the triage vital signs and the nursing notes.   HISTORY  Chief Complaint Chest Pain    HPI DIYANA STARRETT is a 57 y.o. female who presents to the ED from home with a chief complaint of abdominal back and chest pain.  Symptoms started in her bilateral lower back, radiated to her bilateral lower abdomen and radiated up into her chest.  Symptoms began approximately noon after eating lunch.  Patient states her symptoms became severe around 4 PM.  History of diverticulitis but states this is not feels similarly.  Symptoms associated with nausea without vomiting.  Patient reports diarrhea for months.  Denies recent fever, chills, shortness of breath, dysuria.  Denies recent travel or trauma.  No personal history of kidney stones.   Past Medical History:  Diagnosis Date  . Asthma   . Hypertension   . Migraines     There are no active problems to display for this patient.   Past Surgical History:  Procedure Laterality Date  . APPENDECTOMY    . BREAST BIOPSY Bilateral 2011   neg  . PARTIAL HYSTERECTOMY      Prior to Admission medications   Medication Sig Start Date End Date Taking? Authorizing Provider  fexofenadine-pseudoephedrine (ALLEGRA-D) 60-120 MG 12 hr tablet Take 1 tablet by mouth 2 (two) times daily. 07/09/15   Joni Reining, PA-C  ibuprofen (ADVIL,MOTRIN) 800 MG tablet Take 1 tablet (800 mg total) by mouth every 8 (eight) hours as needed. 07/09/15   Joni Reining, PA-C  ondansetron (ZOFRAN ODT) 4 MG disintegrating tablet Take 1 tablet (4 mg total) by mouth every 8 (eight) hours as needed for nausea or vomiting. 01/08/18   Irean Hong, MD  oxyCODONE-acetaminophen (PERCOCET/ROXICET) 5-325 MG tablet Take 1 tablet by mouth every 4 (four) hours as needed for severe  pain. 01/08/18   Irean Hong, MD  sulfamethoxazole-trimethoprim (BACTRIM DS,SEPTRA DS) 800-160 MG tablet Take 1 tablet by mouth 2 (two) times daily. 07/09/15   Joni Reining, PA-C  traMADol (ULTRAM) 50 MG tablet Take 1 tablet (50 mg total) by mouth every 6 (six) hours as needed for moderate pain. 07/09/15   Joni Reining, PA-C    Allergies Penicillins and Prednisone  Family History  Problem Relation Age of Onset  . Breast cancer Mother 69    Social History Social History   Tobacco Use  . Smoking status: Never Smoker  . Smokeless tobacco: Never Used  Substance Use Topics  . Alcohol use: No  . Drug use: Not on file    Review of Systems  Constitutional: No fever/chills Eyes: No visual changes. ENT: No sore throat. Cardiovascular: Positive for chest pain. Respiratory: Denies shortness of breath. Gastrointestinal: As noted for abdominal pain and nausea, no vomiting.  Positive for diarrhea.  No constipation. Genitourinary: Negative for dysuria. Musculoskeletal: Positive for back pain. Skin: Negative for rash. Neurological: Negative for headaches, focal weakness or numbness.   ____________________________________________   PHYSICAL EXAM:  VITAL SIGNS: ED Triage Vitals  Enc Vitals Group     BP 01/07/18 2226 (!) 154/93     Pulse Rate 01/07/18 2226 76     Resp 01/07/18 2226 20     Temp 01/07/18 2226 98 F (36.7 C)     Temp Source 01/07/18 2226 Oral  SpO2 01/07/18 2226 100 %     Weight 01/07/18 2227 242 lb (109.8 kg)     Height 01/07/18 2227 5\' 7"  (1.702 m)     Head Circumference --      Peak Flow --      Pain Score 01/07/18 2226 10     Pain Loc --      Pain Edu? --      Excl. in GC? --     Constitutional: Alert and oriented.  Uncomfortable appearing and in mild acute distress. Eyes: Conjunctivae are normal. PERRL. EOMI. Head: Atraumatic. Nose: No congestion/rhinnorhea. Mouth/Throat: Mucous membranes are moist.  Oropharynx non-erythematous. Neck: No  stridor.   Cardiovascular: Normal rate, regular rhythm. Grossly normal heart sounds.  Good peripheral circulation. Respiratory: Normal respiratory effort.  No retractions. Lungs CTAB. Gastrointestinal: Soft and diffusely tender palpation without rebound or guarding. No distention. No abdominal bruits.  Bilateral CVA tenderness. Musculoskeletal: No lower extremity tenderness nor edema.  No joint effusions. Neurologic:  Normal speech and language. No gross focal neurologic deficits are appreciated.  Skin:  Skin is warm, dry and intact. No rash noted. Psychiatric: Mood and affect are normal. Speech and behavior are normal.  ____________________________________________   LABS (all labs ordered are listed, but only abnormal results are displayed)  Labs Reviewed  COMPREHENSIVE METABOLIC PANEL - Abnormal; Notable for the following components:      Result Value   Glucose, Bld 115 (*)    Creatinine, Ser 1.06 (*)    GFR calc non Af Amer 58 (*)    All other components within normal limits  URINALYSIS, COMPLETE (UACMP) WITH MICROSCOPIC - Abnormal; Notable for the following components:   Color, Urine YELLOW (*)    APPearance CLEAR (*)    Hgb urine dipstick SMALL (*)    Bacteria, UA RARE (*)    All other components within normal limits  CBC  LIPASE, BLOOD  TROPONIN I   ____________________________________________  EKG  ED ECG REPORT I, Thompson Mckim J, the attending physician, personally viewed and interpreted this ECG.   Date: 01/08/2018  EKG Time: 2224  Rate: 74  Rhythm: normal EKG, normal sinus rhythm  Axis: Normal  Intervals:none  ST&T Change: Nonspecific  ____________________________________________  RADIOLOGY  ED MD interpretation: No acute cardiopulmonary process on x-ray; CT scan demonstrates acute pancreatitis  Official radiology report(s): Dg Chest 2 View  Result Date: 01/07/2018 CLINICAL DATA:  Lower abdominal pain, chest pain beginning at noon today. History of  diverticulitis. EXAM: CHEST - 2 VIEW COMPARISON:  None. FINDINGS: Cardiomediastinal silhouette is normal. No pleural effusions or focal consolidations. Trachea projects midline and there is no pneumothorax. Soft tissue planes and included osseous structures are non-suspicious. IMPRESSION: Negative. Electronically Signed   By: Awilda Metro M.D.   On: 01/07/2018 23:10   Ct Abdomen Pelvis W Contrast  Result Date: 01/08/2018 CLINICAL DATA:  57 year old female with abdominal pain. EXAM: CT ABDOMEN AND PELVIS WITH CONTRAST TECHNIQUE: Multidetector CT imaging of the abdomen and pelvis was performed using the standard protocol following bolus administration of intravenous contrast. CONTRAST:  OMNIPAQUE IOHEXOL 300 MG/ML  SOLN COMPARISON:  None. FINDINGS: Lower chest: The visualized lung bases are clear. No intra-abdominal free air or free fluid. Hepatobiliary: Probable mild fatty infiltration of the liver. No intrahepatic biliary ductal dilatation. The gallbladder is unremarkable. Pancreas: Inflammatory changes of the head and uncinate process of the pancreas most consistent with acute pancreatitis. No dilatation of the main pancreatic duct. No fluid collection or abscess. Spleen:  Normal in size without focal abnormality. Adrenals/Urinary Tract: The adrenal glands are unremarkable. Bilateral renal parapelvic cysts. There is no hydronephrosis. The visualized ureters and urinary bladder unremarkable. Stomach/Bowel: Sigmoid diverticulosis without active inflammatory changes. No bowel obstruction. Appendectomy Vascular/Lymphatic: No significant vascular findings are present. No enlarged abdominal or pelvic lymph nodes. Reproductive: Hysterectomy. No pelvic mass. The ovaries are grossly unremarkable as visualized. Other: None Musculoskeletal: Disc desiccation at L5-S1. No acute osseous pathology. IMPRESSION: Acute pancreatitis.  No abscess. Sigmoid diverticulosis. No bowel obstruction or active inflammation.  Electronically Signed   By: Elgie CollardArash  Radparvar M.D.   On: 01/08/2018 03:27    ____________________________________________   PROCEDURES  Procedure(s) performed: None  Procedures  Critical Care performed: No  ____________________________________________   INITIAL IMPRESSION / ASSESSMENT AND PLAN / ED COURSE  As part of my medical decision making, I reviewed the following data within the electronic MEDICAL RECORD NUMBER Nursing notes reviewed and incorporated, Labs reviewed, EKG interpreted, Old chart reviewed, Radiograph reviewed and Notes from prior ED visits   57 year old female who presents to the ED  with abdominal and back pain. Differential diagnosis includes, but is not limited to, ovarian cyst, ovarian torsion, acute appendicitis, diverticulitis, urinary tract infection/pyelonephritis, endometriosis, bowel obstruction, colitis, diverticulitis, renal colic, gastroenteritis, hernia, fibroids, endometriosis, etc.  Laboratory urinalysis results are unremarkable.  Pain is slightly better after 50 mcg IV fentanyl paired with 4 mg IV Zofran for nausea.  Will proceed with CT abdomen/pelvis to evaluate for intra-abdominal etiology.  Will administer 0.5 mg IV Dilaudid paired with 4 mg IV Zofran for nausea.   Clinical Course as of Jan 08 629  Wed Jan 08, 2018  0338 Updated patient and spouse of CT imaging results of pancreatitis.  Will discharge home on Percocet for pain, Zofran for nausea, dietary restrictions and patient will follow-up closely with her PCP.  Strict return precautions given.  Patient and spouse verbalize understanding and agree with plan of care.   [JS]    Clinical Course User Index [JS] Irean HongSung, Kairi Harshbarger J, MD     ____________________________________________   FINAL CLINICAL IMPRESSION(S) / ED DIAGNOSES  Final diagnoses:  Generalized abdominal pain  Acute pancreatitis without infection or necrosis, unspecified pancreatitis type     ED Discharge Orders         Ordered    oxyCODONE-acetaminophen (PERCOCET/ROXICET) 5-325 MG tablet  Every 4 hours PRN     01/08/18 0340    ondansetron (ZOFRAN ODT) 4 MG disintegrating tablet  Every 8 hours PRN     01/08/18 0340       Note:  This document was prepared using Dragon voice recognition software and may include unintentional dictation errors.    Irean HongSung, Yesica Kemler J, MD 01/08/18 [id number]

## 2018-01-10 ENCOUNTER — Encounter: Payer: Self-pay | Admitting: Emergency Medicine

## 2018-01-10 ENCOUNTER — Other Ambulatory Visit: Payer: Self-pay

## 2018-01-10 ENCOUNTER — Emergency Department: Payer: BLUE CROSS/BLUE SHIELD

## 2018-01-10 ENCOUNTER — Emergency Department
Admission: EM | Admit: 2018-01-10 | Discharge: 2018-01-10 | Disposition: A | Discharge status: Home / Self Care | Payer: BLUE CROSS/BLUE SHIELD | Attending: Emergency Medicine | Admitting: Emergency Medicine

## 2018-01-10 DIAGNOSIS — I10 Essential (primary) hypertension: Secondary | ICD-10-CM | POA: Diagnosis not present

## 2018-01-10 DIAGNOSIS — Z79899 Other long term (current) drug therapy: Secondary | ICD-10-CM | POA: Diagnosis not present

## 2018-01-10 DIAGNOSIS — J45909 Unspecified asthma, uncomplicated: Secondary | ICD-10-CM | POA: Insufficient documentation

## 2018-01-10 DIAGNOSIS — R109 Unspecified abdominal pain: Secondary | ICD-10-CM | POA: Diagnosis present

## 2018-01-10 DIAGNOSIS — K859 Acute pancreatitis without necrosis or infection, unspecified: Secondary | ICD-10-CM | POA: Insufficient documentation

## 2018-01-10 LAB — COMPREHENSIVE METABOLIC PANEL
ALT: 12 U/L (ref 0–44)
AST: 12 U/L — AB (ref 15–41)
Albumin: 4.2 g/dL (ref 3.5–5.0)
Alkaline Phosphatase: 83 U/L (ref 38–126)
Anion gap: 13 (ref 5–15)
BUN: 14 mg/dL (ref 6–20)
CO2: 24 mmol/L (ref 22–32)
Calcium: 9.3 mg/dL (ref 8.9–10.3)
Chloride: 101 mmol/L (ref 98–111)
Creatinine, Ser: 1.2 mg/dL — ABNORMAL HIGH (ref 0.44–1.00)
GFR, EST AFRICAN AMERICAN: 58 mL/min — AB (ref >60)
GFR, EST NON AFRICAN AMERICAN: 50 mL/min — AB (ref >60)
Glucose, Bld: 93 mg/dL (ref 70–99)
POTASSIUM: 4.1 mmol/L (ref 3.5–5.1)
Sodium: 138 mmol/L (ref 135–145)
Total Bilirubin: 0.8 mg/dL (ref 0.3–1.2)
Total Protein: 8 g/dL (ref 6.5–8.1)

## 2018-01-10 LAB — LIPASE, BLOOD: LIPASE: 22 U/L (ref 11–51)

## 2018-01-10 LAB — CBC
HEMATOCRIT: 40.1 % (ref 35.0–47.0)
Hemoglobin: 13.8 g/dL (ref 12.0–16.0)
MCH: 30.3 pg (ref 26.0–34.0)
MCHC: 34.4 g/dL (ref 32.0–36.0)
MCV: 88.1 fL (ref 80.0–100.0)
Platelets: 253 10*3/uL (ref 150–440)
RBC: 4.55 MIL/uL (ref 3.80–5.20)
RDW: 14 % (ref 11.5–14.5)
WBC: 8.1 10*3/uL (ref 3.6–11.0)

## 2018-01-10 MED ORDER — OXYCODONE HCL 5 MG PO TABS
5.0000 mg | ORAL_TABLET | Freq: Once | ORAL | Status: AC
  Administered 2018-01-10: 5 mg via ORAL
  Filled 2018-01-10: qty 1

## 2018-01-10 MED ORDER — KETOROLAC TROMETHAMINE 30 MG/ML IJ SOLN
15.0000 mg | Freq: Once | INTRAMUSCULAR | Status: AC
  Administered 2018-01-10: 15 mg via INTRAVENOUS
  Filled 2018-01-10: qty 1

## 2018-01-10 MED ORDER — MORPHINE SULFATE (PF) 4 MG/ML IV SOLN: 4.0000 mg | Freq: Once | INTRAVENOUS | Status: DC

## 2018-01-10 MED ORDER — FENTANYL CITRATE (PF) 100 MCG/2ML IJ SOLN
75.0000 ug | Freq: Once | INTRAMUSCULAR | Status: AC
  Administered 2018-01-10: 75 ug via INTRAVENOUS
  Filled 2018-01-10: qty 2

## 2018-01-10 MED ORDER — ACETAMINOPHEN 500 MG PO TABS
1000.0000 mg | ORAL_TABLET | Freq: Once | ORAL | Status: AC
  Administered 2018-01-10: 1000 mg via ORAL
  Filled 2018-01-10: qty 2

## 2018-01-10 MED ORDER — ONDANSETRON HCL 4 MG/2ML IJ SOLN
4.0000 mg | Freq: Once | INTRAMUSCULAR | Status: AC
  Administered 2018-01-10: 4 mg via INTRAVENOUS
  Filled 2018-01-10: qty 2

## 2018-01-10 MED ORDER — SODIUM CHLORIDE 0.9 % IV BOLUS
1000.0000 mL | Freq: Once | INTRAVENOUS | Status: AC
  Administered 2018-01-10: 1000 mL via INTRAVENOUS

## 2018-01-10 NOTE — ED Notes (Signed)
Pt states only able to eat half of a saltine cracker and a few sips of gingerale due to pain in abd.  MD notified.

## 2018-01-10 NOTE — Discharge Instructions (Addendum)
Pain control: take 2 percocets every 4 hours and 600mg  of ibuprofen every 6 hours.  Follow-up with your doctor Monday.  Return to the emergency room for worsening pain or fever.

## 2018-01-10 NOTE — ED Notes (Signed)
PO challenging patient with saltine crackers and ginger ale per Dr. Arbutus LeasVeronese's.

## 2018-01-10 NOTE — ED Triage Notes (Signed)
Pt arrived via POV from PCP's office. Pt here 2 days ago for same sxs but states they are worse today.  Pt has c/o LUQ abdominal pain, back pain, and nausea without vomiting.  Pt recently dx with pancreatitis on Wednesday. No prior hx.  Pt states she stopped taking the prescribed pain medications because they were not helping. Pt tearful in triage and appears uncomfortable.  Pt's PCP sent her here for dehydration, nausea, abdominal pain and for possible enteral nutrition therapy

## 2018-01-10 NOTE — ED Notes (Signed)
ED Provider at bedside. 

## 2018-01-10 NOTE — ED Provider Notes (Signed)
[facility] Emergency Department Provider Note  ____________________________________________  Time seen: Approximately 6:23 PM  I have reviewed the triage vital signs and the nursing notes.   HISTORY  Chief Complaint Abdominal Pain and Back Pain   HPI Kayla SciaraCynthia S Christian is a 57 y.o. female with recently diagnosed pancreatitis who presents for evaluation of abdominal pain.  Patient was seen here 2 days ago with a CT scan consistent with pancreatitis.  She was sent home on oxycodone.  Patient reports that the oxycodone is not helping.  Today she went to her PCP with uncontrollable pain and was sent here for further evaluation.  Patient reports that she was taking 1 oxycodone every 4 hours for the first 2 days.  She said since it was not helping she stopped taking it today.  She is complaining of 10 out of 10 sharp pain that is located in her epigastric region radiating to her back, constant, associated with nausea.  No fever but she has had night sweats for the last 2 nights.  No dysuria, no vomiting, no diarrhea.  Patient denies drinking alcohol.  She denies any prior history of pancreatitis peer  Past Medical History:  Diagnosis Date  . Asthma   . Hypertension   . Migraines     Past Surgical History:  Procedure Laterality Date  . APPENDECTOMY    . BREAST BIOPSY Bilateral 2011   neg  . PARTIAL HYSTERECTOMY      Prior to Admission medications   Medication Sig Start Date End Date Taking? Authorizing Provider  fexofenadine-pseudoephedrine (ALLEGRA-D) 60-120 MG 12 hr tablet Take 1 tablet by mouth 2 (two) times daily. 07/09/15   Joni ReiningSmith, Ronald K, PA-C  ibuprofen (ADVIL,MOTRIN) 800 MG tablet Take 1 tablet (800 mg total) by mouth every 8 (eight) hours as needed. 07/09/15   Joni ReiningSmith, Ronald K, PA-C  ondansetron (ZOFRAN ODT) 4 MG disintegrating tablet Take 1 tablet (4 mg total) by mouth every 8 (eight) hours as needed for nausea or vomiting. 01/08/18   Irean HongSung, Jade J, MD    oxyCODONE-acetaminophen (PERCOCET/ROXICET) 5-325 MG tablet Take 1 tablet by mouth every 4 (four) hours as needed for severe pain. 01/08/18   Irean HongSung, Jade J, MD  sulfamethoxazole-trimethoprim (BACTRIM DS,SEPTRA DS) 800-160 MG tablet Take 1 tablet by mouth 2 (two) times daily. 07/09/15   Joni ReiningSmith, Ronald K, PA-C  traMADol (ULTRAM) 50 MG tablet Take 1 tablet (50 mg total) by mouth every 6 (six) hours as needed for moderate pain. 07/09/15   Joni ReiningSmith, Ronald K, PA-C    Allergies Penicillins and Prednisone  Family History  Problem Relation Age of Onset  . Breast cancer Mother 2230    Social History Social History   Tobacco Use  . Smoking status: Never Smoker  . Smokeless tobacco: Never Used  Substance Use Topics  . Alcohol use: No  . Drug use: Not on file    Review of Systems  Constitutional: Negative for fever. + Night sweats Eyes: Negative for visual changes. ENT: Negative for sore throat. Neck: No neck pain  Cardiovascular: Negative for chest pain. Respiratory: Negative for shortness of breath. Gastrointestinal: + epigastric abdominal pain and nausea. No vomiting or diarrhea. Genitourinary: Negative for dysuria. Musculoskeletal: Negative for back pain. Skin: Negative for rash. Neurological: Negative for headaches, weakness or numbness. Psych: No SI or HI  ____________________________________________   PHYSICAL EXAM:  VITAL SIGNS: ED Triage Vitals  Enc Vitals Group     BP 01/10/18 1606 (!) 178/89  Pulse Rate 01/10/18 1606 88     Resp 01/10/18 1606 18     Temp 01/10/18 1606 98.6 F (37 C)     Temp Source 01/10/18 1606 Oral     SpO2 01/10/18 1606 94 %     Weight 01/10/18 1607 242 lb (109.8 kg)     Height 01/10/18 1607 5\' 7"  (1.702 m)     Head Circumference --      Peak Flow --      Pain Score 01/10/18 1606 10     Pain Loc --      Pain Edu? --      Excl. in GC? --     Constitutional: Alert and oriented, patient is crying and in distress due to pain. HEENT:       Head: Normocephalic and atraumatic.         Eyes: Conjunctivae are normal. Sclera is non-icteric.       Mouth/Throat: Mucous membranes are moist.       Neck: Supple with no signs of meningismus. Cardiovascular: Regular rate and rhythm. No murmurs, gallops, or rubs. 2+ symmetrical distal pulses are present in all extremities. No JVD. Respiratory: Normal respiratory effort. Lungs are clear to auscultation bilaterally. No wheezes, crackles, or rhonchi.  Gastrointestinal: Soft, with tenderness to palpation in the epigastric region, and non distended with positive bowel sounds. No rebound or guarding. Musculoskeletal: Nontender with normal range of motion in all extremities. No edema, cyanosis, or erythema of extremities. Neurologic: Normal speech and language. Face is symmetric. Moving all extremities. No gross focal neurologic deficits are appreciated. Skin: Skin is warm, dry and intact. No rash noted. Psychiatric: Mood and affect are normal. Speech and behavior are normal.  ____________________________________________   LABS (all labs ordered are listed, but only abnormal results are displayed)  Labs Reviewed  COMPREHENSIVE METABOLIC PANEL - Abnormal; Notable for the following components:      Result Value   Creatinine, Ser 1.20 (*)    AST 12 (*)    GFR calc non Af Amer 50 (*)    GFR calc Af Amer 58 (*)    All other components within normal limits  LIPASE, BLOOD  CBC  URINALYSIS, COMPLETE (UACMP) WITH MICROSCOPIC   ____________________________________________  EKG  ED ECG REPORT I, Nita Sickle, the attending physician, personally viewed and interpreted this ECG.  Normal sinus rhythm, rate of 82, normal intervals, normal axis, LVH, incomplete RBBB, no ST elevations or depressions.  No significant changes when compared to prior from 2 days ago. ____________________________________________  RADIOLOGY  none   ____________________________________________   PROCEDURES  Procedure(s) performed: None Procedures Critical Care performed:  None ____________________________________________   INITIAL IMPRESSION / ASSESSMENT AND PLAN / ED COURSE   57 y.o. female with recently diagnosed pancreatitis 2 days ago who presents for evaluation of uncontrolled abdominal pain.  Patient is crying due to pain, her vitals are within normal limits other than slightly hypertensive which is expected since she is describing severe pain, her abdomen is soft with mild epigastric tenderness, no rebound or guarding, no surgical abdomen on exam. Labs show normal white count, normal LFTs, normal lipase.  Patient with mild AKI but no anion gap.  Will give IV fluids, IV fentanyl, and Zofran for her symptoms.  We will send patient for right upper quadrant ultrasound to rule out gallstone pancreatitis. No signs of sepsis    _________________________ 8:48 PM on 01/10/2018 -----------------------------------------  Labs remain within normal limits.  Pain is much better controlled  at this time.  Patient is tolerating p.o.  Will increase her pain medication to 2 Percocets every 4 hours and 600 mg of ibuprofen every 6 hours.  Recommend close follow-up with primary care doctor.  Discussed strict return precautions.  Per [place] criteria patient is very low risk at this time.   As part of my medical decision making, I reviewed the following data within the electronic MEDICAL RECORD NUMBER Nursing notes reviewed and incorporated, Labs reviewed , EKG interpreted , Old chart reviewed, Radiograph reviewed , Notes from prior ED visits and [place] Controlled Substance Database    Pertinent labs & imaging results that were available during my care of the patient were reviewed by me and considered in my medical decision making (see chart for details).    ____________________________________________   FINAL CLINICAL IMPRESSION(S) / ED  DIAGNOSES  Final diagnoses:  Pancreatitis      NEW MEDICATIONS STARTED DURING THIS VISIT:  ED Discharge Orders    None       Note:  This document was prepared using Dragon voice recognition software and may include unintentional dictation errors.    Nita Sickle, MD 01/10/18 [phone]

## 2018-01-10 NOTE — ED Notes (Signed)
First Nurse Note:  Patient was recently diagnosed with pancreatitis.  Went to PCP today for follow-up visit and was told to come to the ED for intractable pain, possible dehydration and hypertension.  Patient was told she would likely need to be admitted to the hospital.

## 2018-01-10 NOTE — ED Notes (Signed)
Patient transported to Ultrasound 

## 2018-05-01 ENCOUNTER — Other Ambulatory Visit: Payer: Self-pay | Admitting: Nurse Practitioner

## 2018-05-01 DIAGNOSIS — Z1231 Encounter for screening mammogram for malignant neoplasm of breast: Secondary | ICD-10-CM

## 2018-05-28 ENCOUNTER — Ambulatory Visit
Admission: RE | Admit: 2018-05-28 | Discharge: 2018-05-28 | Disposition: A | Discharge status: Home / Self Care | Payer: BLUE CROSS/BLUE SHIELD | Source: Ambulatory Visit | Attending: Nurse Practitioner | Admitting: Nurse Practitioner

## 2018-05-28 DIAGNOSIS — Z1231 Encounter for screening mammogram for malignant neoplasm of breast: Secondary | ICD-10-CM | POA: Diagnosis not present

## 2018-06-02 ENCOUNTER — Other Ambulatory Visit: Payer: Self-pay | Admitting: Nurse Practitioner

## 2018-06-02 DIAGNOSIS — R928 Other abnormal and inconclusive findings on diagnostic imaging of breast: Secondary | ICD-10-CM

## 2018-06-02 DIAGNOSIS — N6310 Unspecified lump in the right breast, unspecified quadrant: Secondary | ICD-10-CM

## 2018-06-09 ENCOUNTER — Ambulatory Visit
Admission: RE | Admit: 2018-06-09 | Discharge: 2018-06-09 | Disposition: A | Discharge status: Home / Self Care | Payer: BLUE CROSS/BLUE SHIELD | Source: Ambulatory Visit | Attending: Nurse Practitioner | Admitting: Nurse Practitioner

## 2018-06-09 DIAGNOSIS — N6310 Unspecified lump in the right breast, unspecified quadrant: Secondary | ICD-10-CM | POA: Diagnosis present

## 2018-06-09 DIAGNOSIS — R928 Other abnormal and inconclusive findings on diagnostic imaging of breast: Secondary | ICD-10-CM | POA: Insufficient documentation

## 2019-11-06 ENCOUNTER — Other Ambulatory Visit: Payer: Self-pay | Admitting: Nurse Practitioner

## 2019-11-06 DIAGNOSIS — Z1231 Encounter for screening mammogram for malignant neoplasm of breast: Secondary | ICD-10-CM

## 2019-11-17 ENCOUNTER — Ambulatory Visit
Admission: RE | Admit: 2019-11-17 | Discharge: 2019-11-17 | Disposition: A | Discharge status: Home / Self Care | Payer: BC Managed Care – PPO | Source: Ambulatory Visit | Attending: Nurse Practitioner | Admitting: Nurse Practitioner

## 2019-11-17 DIAGNOSIS — Z1231 Encounter for screening mammogram for malignant neoplasm of breast: Secondary | ICD-10-CM | POA: Diagnosis not present

## 2022-05-14 IMAGING — MG DIGITAL SCREENING BILAT W/ TOMO W/ CAD
8 series · 8 of 24 positions shown · non-contrast
Comparison: Previous exam(s).

CLINICAL DATA: Screening.

EXAM:
DIGITAL SCREENING BILATERAL MAMMOGRAM WITH TOMO AND CAD

[R MLO synth-2D]
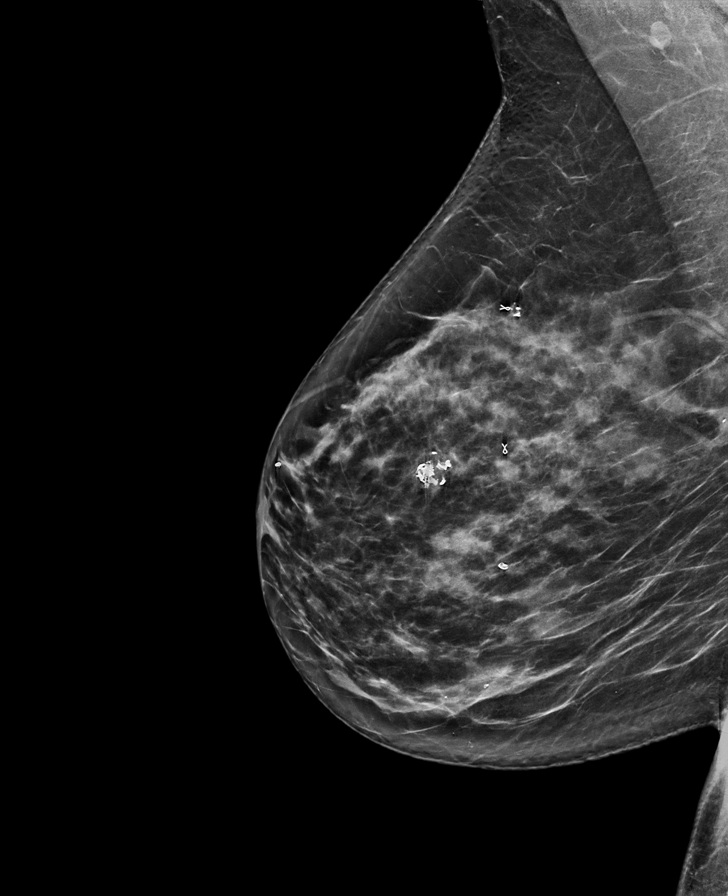

[L MLO synth-2D]
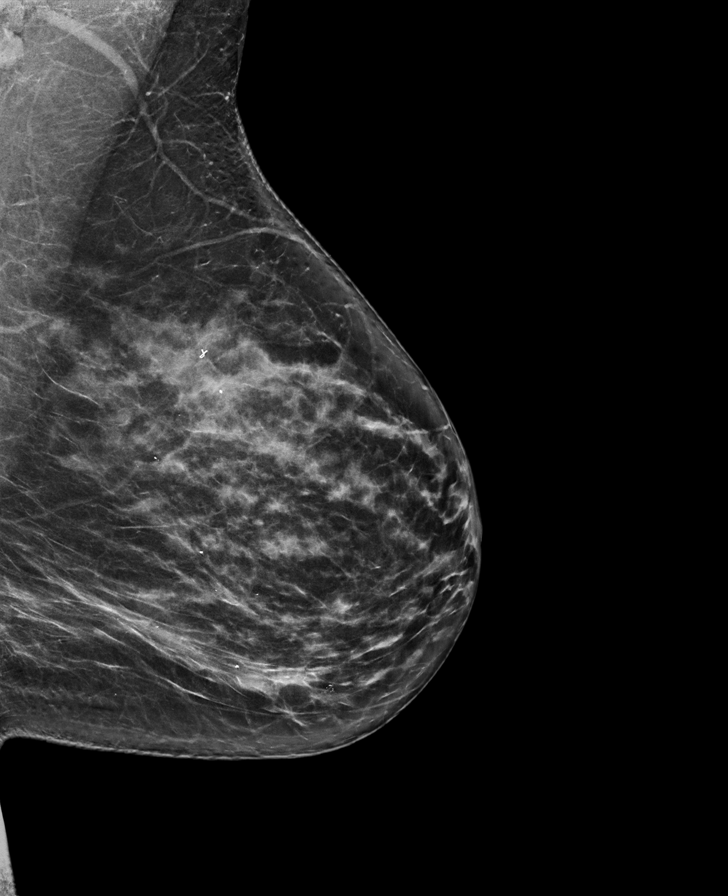

[R CC synth-2D]
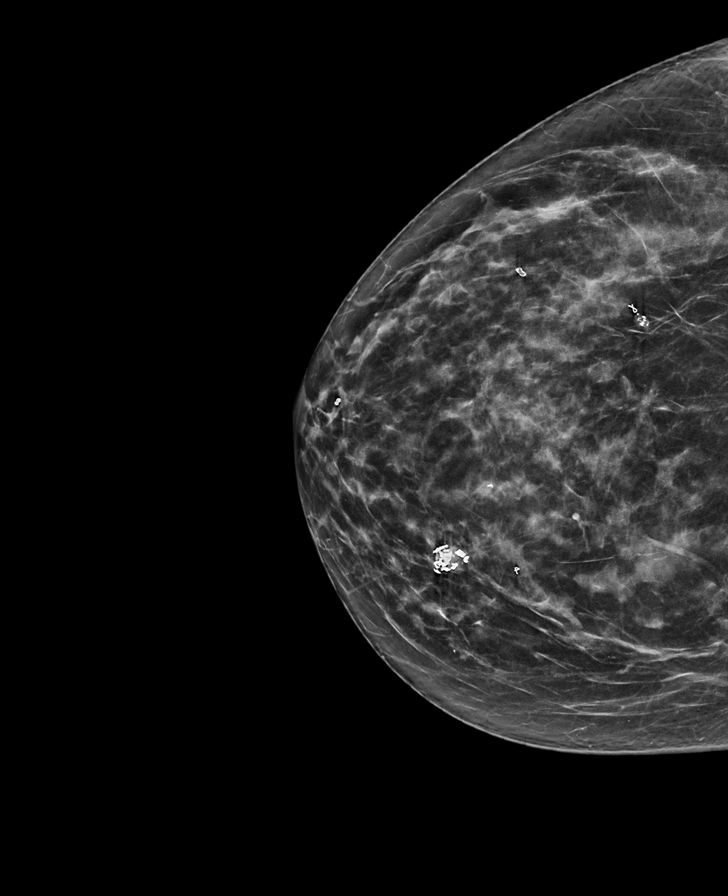

[L CC synth-2D]
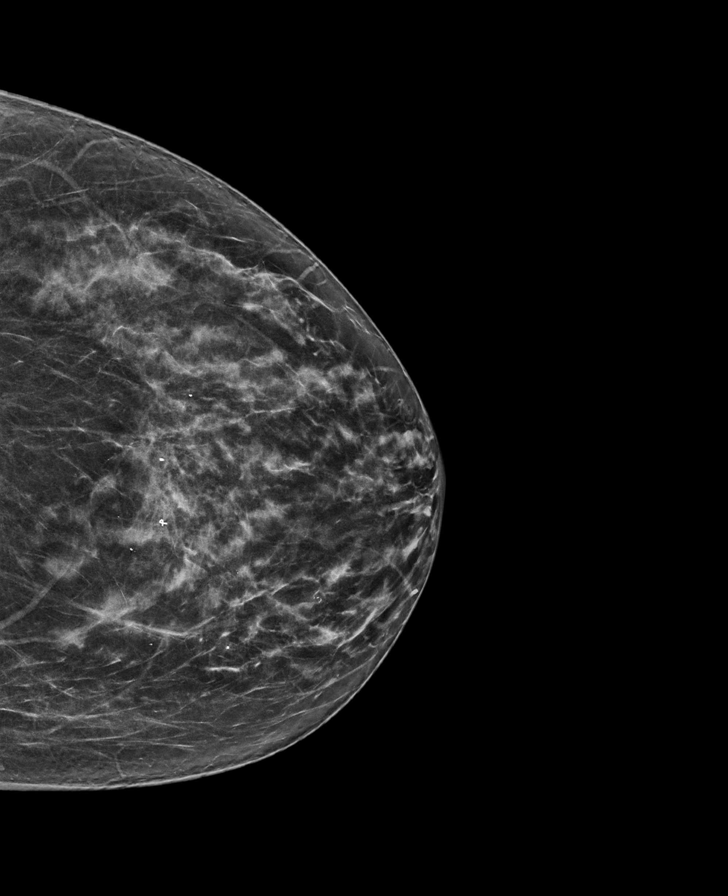

[L MLO tomo · tomo slice 39/78.0]
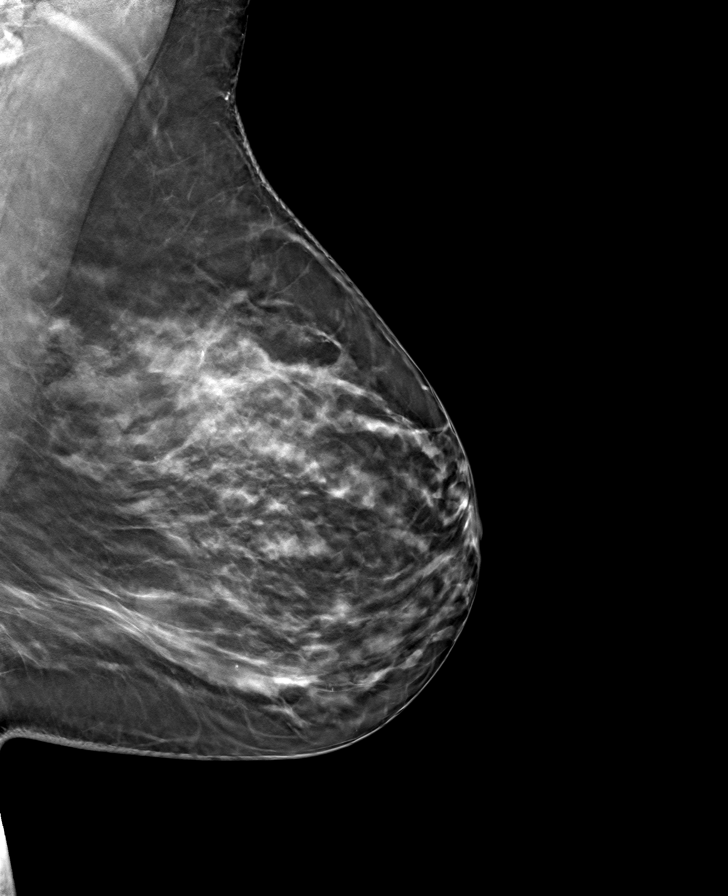

[L CC tomo · tomo slice 33/66.0]
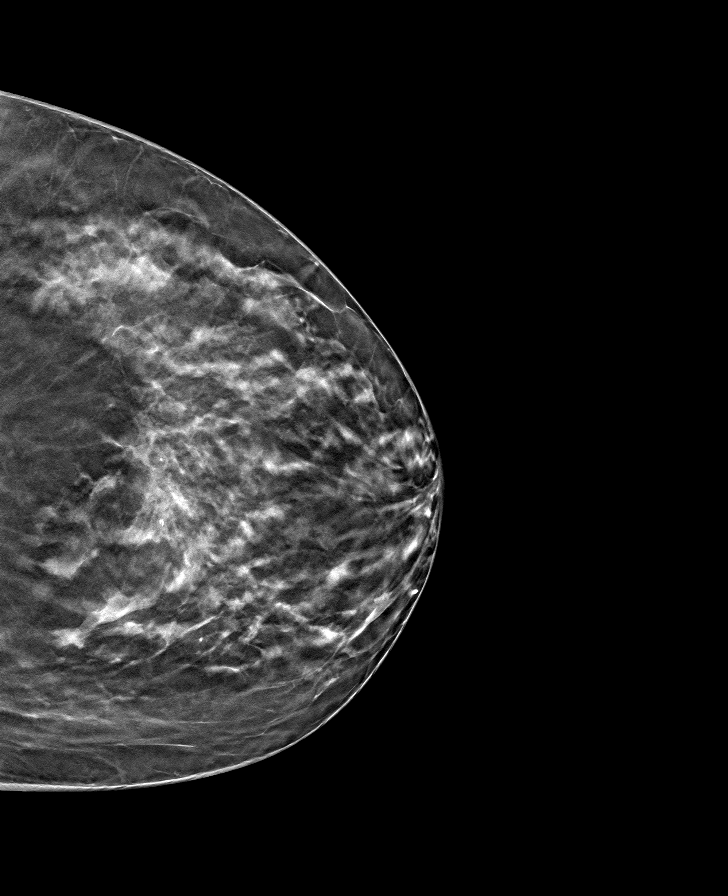

[R CC tomo · tomo slice 34/67.0]
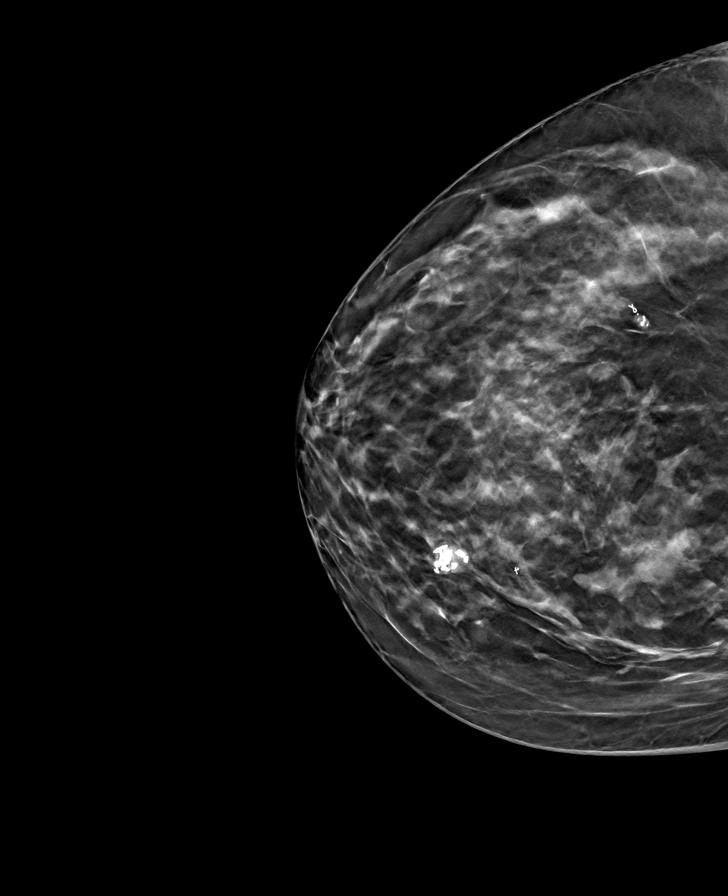

[R MLO tomo · tomo slice 39/76.0]
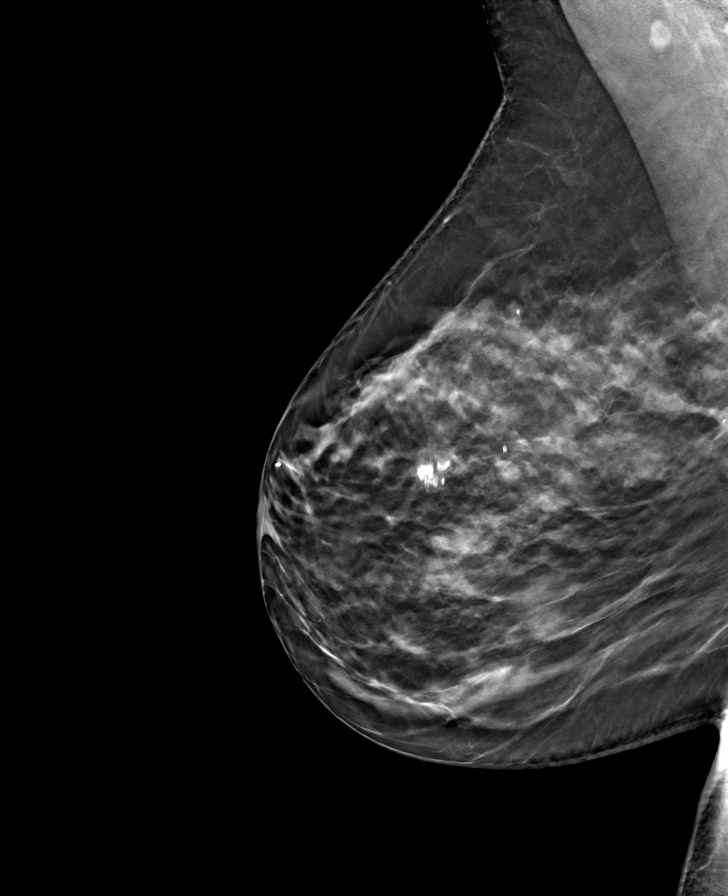

[8 of 24 positions shown; findings below may reference images not displayed]

ACR Breast Density Category c: The breast tissue is heterogeneously
dense, which may obscure small masses.
FINDINGS: There are no findings suspicious for malignancy. Images were
processed with CAD.
IMPRESSION: No mammographic evidence of malignancy. A result letter of this
screening mammogram will be mailed directly to the patient.

RECOMMENDATION:
Screening mammogram in one year. (Code:[id number]-U-LHB)

BI-RADS CATEGORY  1: Negative.

## 2022-11-10 ENCOUNTER — Other Ambulatory Visit: Payer: Self-pay | Admitting: Nurse Practitioner

## 2022-11-10 DIAGNOSIS — E039 Hypothyroidism, unspecified: Secondary | ICD-10-CM

## 2023-01-01 ENCOUNTER — Ambulatory Visit: Payer: Self-pay | Admitting: Nurse Practitioner

## 2023-01-09 ENCOUNTER — Other Ambulatory Visit: Payer: Self-pay | Admitting: Nurse Practitioner

## 2023-01-12 ENCOUNTER — Other Ambulatory Visit: Payer: Self-pay | Admitting: Nurse Practitioner

## 2023-01-12 DIAGNOSIS — I10 Essential (primary) hypertension: Secondary | ICD-10-CM

## 2023-02-04 ENCOUNTER — Ambulatory Visit: Payer: Self-pay | Admitting: Cardiology

## 2023-02-06 ENCOUNTER — Other Ambulatory Visit: Payer: Self-pay | Admitting: Family

## 2023-02-19 ENCOUNTER — Other Ambulatory Visit: Payer: Self-pay | Admitting: Internal Medicine

## 2023-02-19 DIAGNOSIS — E039 Hypothyroidism, unspecified: Secondary | ICD-10-CM

## 2023-02-20 NOTE — Telephone Encounter (Signed)
Called patient to ask if she has another provider and patient stated that she wasn't seeing anyone else & that she didn't need a refill on her thyroid medication, because that was just filled. She needed a refill on her BP medication. I advised patient that she would need an appointment since she hadn't been seen since 12/06/20. Patient then wanted to argue with me about when she had been seen last. I advised patient that she had labs drawn in Jan. 17,2024, but her follow up appointment was cancelled due to [facility] being out. Patient then asked was her BP medication going to refilled & I advised patient that she would need to come in and been seen. Patient stated she didn't have the time to come in & if we weren't going to refill then oh well.

## 2023-04-18 ENCOUNTER — Other Ambulatory Visit: Payer: Self-pay | Admitting: Cardiology

## 2023-04-18 DIAGNOSIS — E782 Mixed hyperlipidemia: Secondary | ICD-10-CM

## 2023-04-18 DIAGNOSIS — E039 Hypothyroidism, unspecified: Secondary | ICD-10-CM

## 2023-04-18 DIAGNOSIS — Z131 Encounter for screening for diabetes mellitus: Secondary | ICD-10-CM

## 2023-04-18 DIAGNOSIS — I10 Essential (primary) hypertension: Secondary | ICD-10-CM

## 2023-04-23 ENCOUNTER — Other Ambulatory Visit: Payer: Self-pay

## 2023-04-23 DIAGNOSIS — Z131 Encounter for screening for diabetes mellitus: Secondary | ICD-10-CM

## 2023-04-23 DIAGNOSIS — I10 Essential (primary) hypertension: Secondary | ICD-10-CM

## 2023-04-23 DIAGNOSIS — E782 Mixed hyperlipidemia: Secondary | ICD-10-CM

## 2023-04-23 DIAGNOSIS — E039 Hypothyroidism, unspecified: Secondary | ICD-10-CM

## 2023-04-24 LAB — LIPID PANEL
Chol/HDL Ratio: 3.4 ratio (ref 0.0–4.4)
Cholesterol, Total: 209 mg/dL — ABNORMAL HIGH (ref 100–199)
HDL: 61 mg/dL (ref >39)
LDL Chol Calc (NIH): 138 mg/dL — ABNORMAL HIGH (ref 0–99)
Triglycerides: 58 mg/dL (ref 0–149)
VLDL Cholesterol Cal: 10 mg/dL (ref 5–40)

## 2023-04-24 LAB — CMP14+EGFR
ALT: 15 [IU]/L (ref 0–32)
AST: 12 [IU]/L (ref 0–40)
Albumin: 4.5 g/dL (ref 3.8–4.9)
Alkaline Phosphatase: 98 [IU]/L (ref 44–121)
BUN/Creatinine Ratio: 16 (ref 12–28)
BUN: 17 mg/dL (ref 8–27)
Bilirubin Total: 0.6 mg/dL (ref 0.0–1.2)
CO2: 26 mmol/L (ref 20–29)
Calcium: 9.9 mg/dL (ref 8.7–10.3)
Chloride: 100 mmol/L (ref 96–106)
Creatinine, Ser: 1.09 mg/dL — ABNORMAL HIGH (ref 0.57–1.00)
Globulin, Total: 2.3 g/dL (ref 1.5–4.5)
Glucose: 93 mg/dL (ref 70–99)
Potassium: 4.6 mmol/L (ref 3.5–5.2)
Sodium: 139 mmol/L (ref 134–144)
Total Protein: 6.8 g/dL (ref 6.0–8.5)
eGFR: 58 mL/min/{1.73_m2} — ABNORMAL LOW (ref >59)

## 2023-04-24 LAB — HEMOGLOBIN A1C
Est. average glucose Bld gHb Est-mCnc: 126 mg/dL
Hgb A1c MFr Bld: 6 % — ABNORMAL HIGH (ref 4.8–5.6)

## 2023-04-24 LAB — TSH: TSH: 2.18 u[IU]/mL (ref 0.450–4.500)

## 2023-04-26 ENCOUNTER — Ambulatory Visit: Payer: Self-pay | Admitting: Cardiology

## 2023-04-26 ENCOUNTER — Encounter: Payer: Self-pay | Admitting: Cardiology

## 2023-04-26 VITALS — BP 136/96 | HR 62 | Ht 67.5 in | Wt 254.2 lb

## 2023-04-26 DIAGNOSIS — I10 Essential (primary) hypertension: Secondary | ICD-10-CM | POA: Insufficient documentation

## 2023-04-26 DIAGNOSIS — E039 Hypothyroidism, unspecified: Secondary | ICD-10-CM | POA: Insufficient documentation

## 2023-04-26 DIAGNOSIS — E782 Mixed hyperlipidemia: Secondary | ICD-10-CM | POA: Insufficient documentation

## 2023-04-26 DIAGNOSIS — J3089 Other allergic rhinitis: Secondary | ICD-10-CM

## 2023-04-26 DIAGNOSIS — Z131 Encounter for screening for diabetes mellitus: Secondary | ICD-10-CM

## 2023-04-26 DIAGNOSIS — J309 Allergic rhinitis, unspecified: Secondary | ICD-10-CM | POA: Insufficient documentation

## 2023-04-26 MED ORDER — ROSUVASTATIN CALCIUM 10 MG PO TABS: 10.0000 mg | ORAL_TABLET | Freq: Every day | ORAL | 5 refills | Status: DC

## 2023-04-26 MED ORDER — FLUTICASONE PROPIONATE 50 MCG/ACT NA SUSP: 1.0000 | Freq: Every day | NASAL | 2 refills | Status: AC

## 2023-04-26 MED ORDER — SERTRALINE HCL 50 MG PO TABS: 50.0000 mg | ORAL_TABLET | Freq: Every day | ORAL | 5 refills | Status: DC

## 2023-04-26 MED ORDER — LEVOTHYROXINE SODIUM 88 MCG PO TABS: 88.0000 ug | ORAL_TABLET | Freq: Every day | ORAL | 5 refills | Status: DC

## 2023-04-26 MED ORDER — AMLODIPINE BESYLATE 5 MG PO TABS: 5.0000 mg | ORAL_TABLET | Freq: Every morning | ORAL | 5 refills | Status: DC

## 2023-04-26 NOTE — Progress Notes (Signed)
Established Patient Office Visit  Subjective:  Patient ID: Kayla Christian, female    DOB: October 05, 1960  Age: 62 y.o. MRN: [id number]  Chief Complaint  Patient presents with   Follow-up    Medication refills    Patient in office needing medication refills. Has not been seen in office since 12/06/2020. Patient with multiple complaints today. Patient complaining of fatigue. Has a history of vitamin D deficiency. Will add a vitamin D level to recent lab work.  Patient also complaining of a cough, with rhinnorhea, shortness of breath, sore throat, PND, nasal congestion and headaches. Patient was taking allegra D in the past, recommend restarting, Flonase restarted.  Patient taking omeprazole for acid reflux, offered to send in pantoprazole, patient declined due to being self pay.  Patient over due for mammogram, colonoscopy. Patient is self pay, she prefers to defer at this time.   Cough This is a new problem. The current episode started 1 to 4 weeks ago. The problem has been unchanged. The problem occurs every few hours. The cough is Non-productive. Associated symptoms include headaches, nasal congestion, postnasal drip, rhinorrhea, a sore throat and shortness of breath. Pertinent negatives include no chest pain or myalgias. The symptoms are aggravated by lying down. The treatment provided no relief. There is no history of COPD.    No other concerns at this time.   Past Medical History:  Diagnosis Date   Asthma    Hypertension    Migraines     Past Surgical History:  Procedure Laterality Date   APPENDECTOMY     BREAST BIOPSY Bilateral 2011   neg   PARTIAL HYSTERECTOMY      Social History   Socioeconomic History   Marital status: Married    Spouse name: Not on file   Number of children: Not on file   Years of education: Not on file   Highest education level: Not on file  Occupational History   Not on file  Tobacco Use   Smoking status: Never   Smokeless tobacco: Never   Substance and Sexual Activity   Alcohol use: No   Drug use: Not on file   Sexual activity: Not on file  Other Topics Concern   Not on file  Social History Narrative   Not on file   Social Determinants of Health   Financial Resource Strain: Not on file  Food Insecurity: Not on file  Transportation Needs: Not on file  Physical Activity: Not on file  Stress: Not on file  Social Connections: Not on file  Intimate Partner Violence: Not on file    Family History  Problem Relation Age of Onset   Breast cancer Mother 42    Allergies  Allergen Reactions   Penicillins Anaphylaxis   Prednisone Itching    Review of Systems  Constitutional: Negative.   HENT:  Positive for postnasal drip, rhinorrhea and sore throat.   Eyes: Negative.   Respiratory:  Positive for cough and shortness of breath.   Cardiovascular: Negative.  Negative for chest pain.  Gastrointestinal:  Positive for nausea. Negative for abdominal pain, constipation and diarrhea.  Genitourinary: Negative.   Musculoskeletal:  Negative for joint pain and myalgias.  Skin: Negative.   Neurological:  Positive for headaches. Negative for dizziness.  Endo/Heme/Allergies: Negative.   All other systems reviewed and are negative.      Objective:   BP (!) 136/96   Pulse 62   Ht 5' 7.5" (1.715 m)   Wt 254 lb 3.2  oz (115.3 kg)   SpO2 97%   BMI 39.23 kg/m   Vitals:   04/26/23 1448  BP: (!) 136/96  Pulse: 62  Height: 5' 7.5" (1.715 m)  Weight: 254 lb 3.2 oz (115.3 kg)  SpO2: 97%  BMI (Calculated): 39.2    Physical Exam Vitals and nursing note reviewed.  Constitutional:      Appearance: Normal appearance. She is normal weight.  HENT:     Head: Normocephalic and atraumatic.     Nose: Nose normal.     Mouth/Throat:     Mouth: Mucous membranes are moist.  Eyes:     Extraocular Movements: Extraocular movements intact.     Conjunctiva/sclera: Conjunctivae normal.     Pupils: Pupils are equal, round, and  reactive to light.  Cardiovascular:     Rate and Rhythm: Normal rate and regular rhythm.     Pulses: Normal pulses.     Heart sounds: Normal heart sounds.  Pulmonary:     Effort: Pulmonary effort is normal.     Breath sounds: Normal breath sounds.  Abdominal:     General: Abdomen is flat. Bowel sounds are normal.     Palpations: Abdomen is soft.  Musculoskeletal:        General: Normal range of motion.     Cervical back: Normal range of motion.  Skin:    General: Skin is warm and dry.  Neurological:     General: No focal deficit present.     Mental Status: She is alert and oriented to person, place, and time.  Psychiatric:        Mood and Affect: Mood normal.        Behavior: Behavior normal.        Thought Content: Thought content normal.        Judgment: Judgment normal.      No results found for any visits on 04/26/23.  Recent Results (from the past 2160 hour(s))  TSH     Status: None   Collection Time: 04/23/23  8:48 AM  Result Value Ref Range   TSH 2.180 0.450 - 4.500 uIU/mL  Hemoglobin A1c     Status: Abnormal   Collection Time: 04/23/23  8:48 AM  Result Value Ref Range   Hgb A1c MFr Bld 6.0 (H) 4.8 - 5.6 %    Comment:          Prediabetes: 5.7 - 6.4          Diabetes: >6.4          Glycemic control for adults with diabetes: <7.0    Est. average glucose Bld gHb Est-mCnc 126 mg/dL  [id number]     Status: Abnormal   Collection Time: 04/23/23  8:48 AM  Result Value Ref Range   Glucose 93 70 - 99 mg/dL   BUN 17 8 - 27 mg/dL   Creatinine, Ser 4.09 (H) 0.57 - 1.00 mg/dL   eGFR 58 (L) >81 XB/JYN/8.29   BUN/Creatinine Ratio 16 12 - 28   Sodium 139 134 - 144 mmol/L   Potassium 4.6 3.5 - 5.2 mmol/L   Chloride 100 96 - 106 mmol/L   CO2 26 20 - 29 mmol/L   Calcium 9.9 8.7 - 10.3 mg/dL   Total Protein 6.8 6.0 - 8.5 g/dL   Albumin 4.5 3.8 - 4.9 g/dL   Globulin, Total 2.3 1.5 - 4.5 g/dL   Bilirubin Total 0.6 0.0 - 1.2 mg/dL   Alkaline Phosphatase 98 44 - 121 IU/L  AST 12 0 - 40 IU/L   ALT 15 0 - 32 IU/L  Lipid panel     Status: Abnormal   Collection Time: 04/23/23  8:48 AM  Result Value Ref Range   Cholesterol, Total 209 (H) 100 - 199 mg/dL   Triglycerides 58 0 - 149 mg/dL   HDL 61 >72 mg/dL   VLDL Cholesterol Cal 10 5 - 40 mg/dL   LDL Chol Calc (NIH) 536 (H) 0 - 99 mg/dL   Chol/HDL Ratio 3.4 0.0 - 4.4 ratio    Comment:                                   T. Chol/HDL Ratio                                             Men  Women                               1/2 Avg.Risk  3.4    3.3                                   Avg.Risk  5.0    4.4                                2X Avg.Risk  9.6    7.1                                3X Avg.Risk 23.4   11.0       Assessment & Plan:  Allegra D and Flonase for allergies.  Medications refilled.  Vitamin D level added to recent blood work,   Problem List Items Addressed This Visit       Cardiovascular and Mediastinum   Primary hypertension   Relevant Medications   amLODipine (NORVASC) 5 MG tablet   rosuvastatin (CRESTOR) 10 MG tablet     Respiratory   Allergic rhinitis due to allergen     Endocrine   Hypothyroidism   Relevant Medications   levothyroxine (SYNTHROID) 88 MCG tablet     Other   Mixed hyperlipidemia - Primary   Relevant Medications   amLODipine (NORVASC) 5 MG tablet   rosuvastatin (CRESTOR) 10 MG tablet   Other Visit Diagnoses     Diabetes mellitus screening       Hypothyroidism, unspecified       Relevant Medications   levothyroxine (SYNTHROID) 88 MCG tablet   Essential (primary) hypertension       Relevant Medications   amLODipine (NORVASC) 5 MG tablet   rosuvastatin (CRESTOR) 10 MG tablet       Return in about 4 weeks (around 05/24/2023).   Total time spent: 25 minutes  [employer], NP  04/26/2023   This document may have been prepared by Dragon Voice Recognition software and as such may include unintentional dictation errors.

## 2023-04-27 LAB — VITAMIN D 25 HYDROXY (VIT D DEFICIENCY, FRACTURES): Vit D, 25-Hydroxy: 14.4 ng/mL — ABNORMAL LOW (ref 30.0–100.0)

## 2023-04-27 LAB — SPECIMEN STATUS REPORT

## 2023-04-29 ENCOUNTER — Other Ambulatory Visit: Payer: Self-pay | Admitting: Cardiology

## 2023-04-29 MED ORDER — VITAMIN D (ERGOCALCIFEROL) 1.25 MG (50000 UNIT) PO CAPS: 50000.0000 [IU] | ORAL_CAPSULE | ORAL | 11 refills | Status: AC

## 2023-10-29 ENCOUNTER — Other Ambulatory Visit: Payer: Self-pay | Admitting: Cardiology

## 2023-10-29 DIAGNOSIS — E039 Hypothyroidism, unspecified: Secondary | ICD-10-CM

## 2024-05-05 ENCOUNTER — Other Ambulatory Visit: Payer: Self-pay

## 2024-05-05 DIAGNOSIS — E039 Hypothyroidism, unspecified: Secondary | ICD-10-CM

## 2024-05-07 ENCOUNTER — Other Ambulatory Visit: Payer: Self-pay | Admitting: Cardiology

## 2024-05-07 DIAGNOSIS — E039 Hypothyroidism, unspecified: Secondary | ICD-10-CM

## 2024-05-08 ENCOUNTER — Ambulatory Visit: Payer: Self-pay | Admitting: Cardiology

## 2024-05-08 ENCOUNTER — Encounter: Payer: Self-pay | Admitting: Cardiology

## 2024-05-08 VITALS — BP 132/80 | HR 77 | Ht 67.0 in | Wt 254.8 lb

## 2024-05-08 DIAGNOSIS — R7303 Prediabetes: Secondary | ICD-10-CM

## 2024-05-08 DIAGNOSIS — E039 Hypothyroidism, unspecified: Secondary | ICD-10-CM

## 2024-05-08 DIAGNOSIS — E66812 Obesity, class 2: Secondary | ICD-10-CM

## 2024-05-08 DIAGNOSIS — Z6839 Body mass index (BMI) 39.0-39.9, adult: Secondary | ICD-10-CM

## 2024-05-08 DIAGNOSIS — I10 Essential (primary) hypertension: Secondary | ICD-10-CM

## 2024-05-08 DIAGNOSIS — E782 Mixed hyperlipidemia: Secondary | ICD-10-CM

## 2024-05-08 MED ORDER — ROSUVASTATIN CALCIUM 10 MG PO TABS: 10.0000 mg | ORAL_TABLET | Freq: Every day | ORAL | 5 refills | Status: AC

## 2024-05-08 MED ORDER — LEVOTHYROXINE SODIUM 88 MCG PO TABS: 88.0000 ug | ORAL_TABLET | Freq: Every day | ORAL | 5 refills | Status: DC

## 2024-05-08 MED ORDER — AZITHROMYCIN 250 MG PO TABS: ORAL_TABLET | ORAL | 0 refills | Status: AC

## 2024-05-08 MED ORDER — SERTRALINE HCL 50 MG PO TABS: 50.0000 mg | ORAL_TABLET | Freq: Every day | ORAL | 5 refills | Status: AC

## 2024-05-08 MED ORDER — AMLODIPINE BESYLATE 5 MG PO TABS: 5.0000 mg | ORAL_TABLET | Freq: Every morning | ORAL | 5 refills | Status: AC

## 2024-05-08 MED ORDER — ALBUTEROL SULFATE HFA 108 (90 BASE) MCG/ACT IN AERS: 1.0000 | INHALATION_SPRAY | Freq: Four times a day (QID) | RESPIRATORY_TRACT | 3 refills | Status: AC | PRN

## 2024-05-08 NOTE — Progress Notes (Signed)
 Established Patient Office Visit  Subjective:  Patient ID: Kayla Christian, female    DOB: 1961-03-15  Age: 63 y.o. MRN: [id number]  Chief Complaint  Patient presents with   Follow-up    Med refill    Patient in office for overdue follow up. Did not have lab work done. Patient currently does not have insurance.  Patient complaining of sinus pain and congestion, started a few weeks ago. Will send in a Z-pack. Return for fasting lab work.  Blood pressure controlled today. Continue current medications.    No other concerns at this time.   Past Medical History:  Diagnosis Date   Asthma    Hypertension    Migraines     Past Surgical History:  Procedure Laterality Date   APPENDECTOMY     BREAST BIOPSY Bilateral 2011   neg   PARTIAL HYSTERECTOMY      Social History   Socioeconomic History   Marital status: Married    Spouse name: Not on file   Number of children: Not on file   Years of education: Not on file   Highest education level: Not on file  Occupational History   Not on file  Tobacco Use   Smoking status: Never   Smokeless tobacco: Never  Substance and Sexual Activity   Alcohol use: No   Drug use: Not on file   Sexual activity: Not on file  Other Topics Concern   Not on file  Social History Narrative   Not on file   Social Drivers of Health   Financial Resource Strain: Not on file  Food Insecurity: Not on file  Transportation Needs: Not on file  Physical Activity: Not on file  Stress: Not on file  Social Connections: Not on file  Intimate Partner Violence: Not on file    Family History  Problem Relation Age of Onset   Breast cancer Mother 2    Allergies  Allergen Reactions   Penicillins Anaphylaxis   Prednisone Itching    Outpatient Medications Prior to Visit  Medication Sig   fluticasone  (FLONASE ) 50 MCG/ACT nasal spray Place 1 spray into both nostrils daily.   ibuprofen  (ADVIL ,MOTRIN ) 800 MG tablet Take 1 tablet (800 mg total)  by mouth every 8 (eight) hours as needed.   omeprazole (PRILOSEC) 10 MG capsule Take 10 mg by mouth daily.   traMADol  (ULTRAM ) 50 MG tablet Take 1 tablet (50 mg total) by mouth every 6 (six) hours as needed for moderate pain.   [DISCONTINUED] albuterol (VENTOLIN HFA) 108 (90 Base) MCG/ACT inhaler Inhale 1-2 puffs into the lungs every 6 (six) hours as needed for wheezing or shortness of breath.   [DISCONTINUED] amLODipine  (NORVASC ) 5 MG tablet Take 1 tablet (5 mg total) by mouth every morning.   [DISCONTINUED] levothyroxine  (SYNTHROID ) 88 MCG tablet TAKE 1 TABLET BY MOUTH DAILY BEFORE BREAKFAST.   [DISCONTINUED] rosuvastatin  (CRESTOR ) 10 MG tablet Take 1 tablet (10 mg total) by mouth daily.   [DISCONTINUED] sertraline  (ZOLOFT ) 50 MG tablet Take 1 tablet (50 mg total) by mouth daily.   Vitamin D , Ergocalciferol , (DRISDOL ) 1.25 MG (50000 UNIT) CAPS capsule Take 1 capsule (50,000 Units total) by mouth every 7 (seven) days. (Patient not taking: Reported on 05/08/2024)   No facility-administered medications prior to visit.    Review of Systems  Constitutional: Negative.   HENT:  Positive for congestion and sinus pain.   Eyes: Negative.   Respiratory: Negative.  Negative for cough and shortness of breath.  Cardiovascular: Negative.  Negative for chest pain.  Gastrointestinal: Negative.  Negative for abdominal pain, constipation and diarrhea.  Genitourinary: Negative.   Musculoskeletal:  Negative for joint pain and myalgias.  Skin: Negative.   Neurological: Negative.  Negative for dizziness and headaches.  Endo/Heme/Allergies: Negative.   All other systems reviewed and are negative.      Objective:   BP 132/80   Pulse 77   Ht 5' 7 (1.702 m)   Wt 254 lb 12.8 oz (115.6 kg)   SpO2 97%   BMI 39.91 kg/m   Vitals:   05/08/24 1011  BP: 132/80  Pulse: 77  Height: 5' 7 (1.702 m)  Weight: 254 lb 12.8 oz (115.6 kg)  SpO2: 97%  BMI (Calculated): 39.9    Physical Exam Vitals and  nursing note reviewed.  Constitutional:      Appearance: Normal appearance. She is normal weight.  HENT:     Head: Normocephalic and atraumatic.     Nose: Nose normal.     Mouth/Throat:     Mouth: Mucous membranes are moist.  Eyes:     Extraocular Movements: Extraocular movements intact.     Conjunctiva/sclera: Conjunctivae normal.     Pupils: Pupils are equal, round, and reactive to light.  Cardiovascular:     Rate and Rhythm: Normal rate and regular rhythm.     Pulses: Normal pulses.     Heart sounds: Normal heart sounds.  Pulmonary:     Effort: Pulmonary effort is normal.     Breath sounds: Normal breath sounds.  Abdominal:     General: Abdomen is flat. Bowel sounds are normal.     Palpations: Abdomen is soft.  Musculoskeletal:        General: Normal range of motion.     Cervical back: Normal range of motion.  Skin:    General: Skin is warm and dry.  Neurological:     General: No focal deficit present.     Mental Status: She is alert and oriented to person, place, and time.  Psychiatric:        Mood and Affect: Mood normal.        Behavior: Behavior normal.        Thought Content: Thought content normal.        Judgment: Judgment normal.      No results found for any visits on 05/08/24.  No results found for this or any previous visit (from the past 2160 hours).    Assessment & Plan:  Z-pack Return for fasting lab work Continue current medications  Problem List Items Addressed This Visit       Cardiovascular and Mediastinum   Primary hypertension - Primary   Relevant Medications   rosuvastatin  (CRESTOR ) 10 MG tablet   amLODipine  (NORVASC ) 5 MG tablet   Other Relevant Orders   CMP14+EGFR     Endocrine   Hypothyroidism, unspecified   Relevant Medications   levothyroxine  (SYNTHROID ) 88 MCG tablet   Other Relevant Orders   TSH     Other   Mixed hyperlipidemia   Relevant Medications   rosuvastatin  (CRESTOR ) 10 MG tablet   amLODipine  (NORVASC ) 5 MG  tablet   Other Relevant Orders   Lipid Profile   Class 2 severe obesity due to excess calories with serious comorbidity and body mass index (BMI) of 39.0 to 39.9 in adult   Prediabetes   Relevant Orders   Hemoglobin A1c   Other Visit Diagnoses       Essential (  primary) hypertension       Relevant Medications   rosuvastatin  (CRESTOR ) 10 MG tablet   amLODipine  (NORVASC ) 5 MG tablet       Return in about 4 months (around 09/05/2024) for fasting labs prior.   Total time spent: 25 minutes. This time includes review of previous notes and results and patient face to face interaction during today's visit.    Jeoffrey Pollen, NP  05/08/2024   This document may have been prepared by Dragon Voice Recognition software and as such may include unintentional dictation errors.

## 2024-05-09 LAB — LIPID PANEL
Chol/HDL Ratio: 3.9 ratio (ref 0.0–4.4)
Cholesterol, Total: 228 mg/dL — ABNORMAL HIGH (ref 100–199)
HDL: 59 mg/dL (ref >39)
LDL Chol Calc (NIH): 156 mg/dL — ABNORMAL HIGH (ref 0–99)
Triglycerides: 75 mg/dL (ref 0–149)
VLDL Cholesterol Cal: 13 mg/dL (ref 5–40)

## 2024-05-09 LAB — CMP14+EGFR
ALT: 13 IU/L (ref 0–32)
AST: 13 IU/L (ref 0–40)
Albumin: 4.1 g/dL (ref 3.9–4.9)
Alkaline Phosphatase: 91 IU/L (ref 49–135)
BUN/Creatinine Ratio: 19 (ref 12–28)
BUN: 18 mg/dL (ref 8–27)
Bilirubin Total: 0.6 mg/dL (ref 0.0–1.2)
CO2: 23 mmol/L (ref 20–29)
Calcium: 9.2 mg/dL (ref 8.7–10.3)
Chloride: 104 mmol/L (ref 96–106)
Creatinine, Ser: 0.94 mg/dL (ref 0.57–1.00)
Globulin, Total: 2.7 g/dL (ref 1.5–4.5)
Glucose: 92 mg/dL (ref 70–99)
Potassium: 4 mmol/L (ref 3.5–5.2)
Sodium: 142 mmol/L (ref 134–144)
Total Protein: 6.8 g/dL (ref 6.0–8.5)
eGFR: 69 mL/min/1.73 (ref >59)

## 2024-05-09 LAB — HEMOGLOBIN A1C
Est. average glucose Bld gHb Est-mCnc: 114 mg/dL
Hgb A1c MFr Bld: 5.6 % (ref 4.8–5.6)

## 2024-05-09 LAB — TSH: TSH: 3.02 u[IU]/mL (ref 0.450–4.500)

## 2024-05-11 ENCOUNTER — Ambulatory Visit: Payer: Self-pay | Admitting: Cardiology

## 2024-05-22 ENCOUNTER — Other Ambulatory Visit: Payer: Self-pay

## 2024-06-01 ENCOUNTER — Other Ambulatory Visit: Payer: Self-pay | Admitting: Cardiology

## 2024-06-01 MED ORDER — LEVOFLOXACIN 750 MG PO TABS: 750.0000 mg | ORAL_TABLET | Freq: Every day | ORAL | 0 refills | Status: AC

## 2024-06-06 ENCOUNTER — Other Ambulatory Visit: Payer: Self-pay | Admitting: Cardiology

## 2024-06-06 DIAGNOSIS — E039 Hypothyroidism, unspecified: Secondary | ICD-10-CM

## 2024-09-07 ENCOUNTER — Ambulatory Visit: Payer: Self-pay | Admitting: Cardiology
